# Patient Record
Sex: Male | Born: 1980 | Race: Black or African American | Hispanic: No | Marital: Single | Smoking: Never smoker
Health system: Southern US, Community
[De-identification: ages and names within clinical notes are randomized; demographics above are authoritative.]

## PROBLEM LIST (undated history)

## (undated) ENCOUNTER — Emergency Department (HOSPITAL_BASED_OUTPATIENT_CLINIC_OR_DEPARTMENT_OTHER): Admission: EM | Payer: 59 | Source: Home / Self Care

## (undated) DIAGNOSIS — I10 Essential (primary) hypertension: Secondary | ICD-10-CM

## (undated) DIAGNOSIS — J45909 Unspecified asthma, uncomplicated: Secondary | ICD-10-CM

## (undated) HISTORY — DX: Unspecified asthma, uncomplicated: J45.909

## (undated) HISTORY — DX: Essential (primary) hypertension: I10

---

## 2001-12-04 ENCOUNTER — Emergency Department (HOSPITAL_COMMUNITY): Admission: EM | Admit: 2001-12-04 | Discharge: 2001-12-05 | Payer: Self-pay | Admitting: Emergency Medicine

## 2001-12-04 ENCOUNTER — Encounter: Payer: Self-pay | Admitting: Emergency Medicine

## 2003-02-12 ENCOUNTER — Emergency Department (HOSPITAL_COMMUNITY): Admission: EM | Admit: 2003-02-12 | Discharge: 2003-02-12 | Payer: Self-pay | Admitting: Emergency Medicine

## 2005-02-05 ENCOUNTER — Emergency Department (HOSPITAL_COMMUNITY): Admission: EM | Admit: 2005-02-05 | Discharge: 2005-02-05 | Payer: Self-pay | Admitting: Emergency Medicine

## 2012-06-22 ENCOUNTER — Telehealth: Payer: Self-pay | Admitting: "Endocrinology

## 2012-09-23 NOTE — Telephone Encounter (Signed)
Opened in error

## 2014-02-12 ENCOUNTER — Ambulatory Visit (INDEPENDENT_AMBULATORY_CARE_PROVIDER_SITE_OTHER): Payer: Managed Care, Other (non HMO) | Admitting: Family

## 2014-02-12 ENCOUNTER — Encounter (INDEPENDENT_AMBULATORY_CARE_PROVIDER_SITE_OTHER): Payer: Self-pay

## 2014-02-12 ENCOUNTER — Encounter: Payer: Self-pay | Admitting: Family

## 2014-02-12 VITALS — BP 110/76 | HR 64 | Temp 97.6°F | Resp 16 | Ht 66.0 in | Wt 198.1 lb

## 2014-02-12 DIAGNOSIS — Z0000 Encounter for general adult medical examination without abnormal findings: Secondary | ICD-10-CM

## 2014-02-12 DIAGNOSIS — N4889 Other specified disorders of penis: Secondary | ICD-10-CM

## 2014-02-12 DIAGNOSIS — N489 Disorder of penis, unspecified: Secondary | ICD-10-CM | POA: Insufficient documentation

## 2014-02-12 LAB — CBC WITH DIFFERENTIAL/PLATELET
Basophils Absolute: 0.1 10*3/uL (ref 0.0–0.1)
Basophils Relative: 1 % (ref 0–1)
Eosinophils Absolute: 0.2 10*3/uL (ref 0.0–0.7)
Eosinophils Relative: 3 % (ref 0–5)
HCT: 43.2 % (ref 39.0–52.0)
Hemoglobin: 14.9 g/dL (ref 13.0–17.0)
Lymphocytes Relative: 35 % (ref 12–46)
Lymphs Abs: 1.8 10*3/uL (ref 0.7–4.0)
MCH: 29.1 pg (ref 26.0–34.0)
MCHC: 34.5 g/dL (ref 30.0–36.0)
MCV: 84.4 fL (ref 78.0–100.0)
Monocytes Absolute: 0.4 10*3/uL (ref 0.1–1.0)
Monocytes Relative: 7 % (ref 3–12)
Neutro Abs: 2.8 10*3/uL (ref 1.7–7.7)
Neutrophils Relative %: 54 % (ref 43–77)
Platelets: 247 10*3/uL (ref 150–400)
RBC: 5.12 MIL/uL (ref 4.22–5.81)
RDW: 14.2 % (ref 11.5–15.5)
WBC: 5.1 10*3/uL (ref 4.0–10.5)

## 2014-02-12 LAB — LIPID PANEL
Cholesterol: 166 mg/dL (ref 0–200)
HDL: 39 mg/dL — ABNORMAL LOW (ref >39)
LDL Cholesterol: 114 mg/dL — ABNORMAL HIGH (ref 0–99)
Total CHOL/HDL Ratio: 4.3 Ratio
Triglycerides: 64 mg/dL (ref <150)
VLDL: 13 mg/dL (ref 0–40)

## 2014-02-12 LAB — BASIC METABOLIC PANEL WITH GFR
BUN: 14 mg/dL (ref 6–23)
CO2: 29 mEq/L (ref 19–32)
Calcium: 9.6 mg/dL (ref 8.4–10.5)
Chloride: 103 mEq/L (ref 96–112)
Creat: 1.31 mg/dL (ref 0.50–1.35)
GFR, Est African American: 83 mL/min
GFR, Est Non African American: 72 mL/min
Glucose, Bld: 86 mg/dL (ref 70–99)
Potassium: 4.3 mEq/L (ref 3.5–5.3)
Sodium: 139 mEq/L (ref 135–145)

## 2014-02-12 LAB — HEPATIC FUNCTION PANEL
ALT: 18 U/L (ref 0–53)
AST: 21 U/L (ref 0–37)
Albumin: 4.4 g/dL (ref 3.5–5.2)
Alkaline Phosphatase: 64 U/L (ref 39–117)
Bilirubin, Direct: 0.1 mg/dL (ref 0.0–0.3)
Indirect Bilirubin: 0.5 mg/dL (ref 0.2–1.2)
Total Bilirubin: 0.6 mg/dL (ref 0.2–1.2)
Total Protein: 7.6 g/dL (ref 6.0–8.3)

## 2014-02-12 LAB — TSH: TSH: 1.18 u[IU]/mL (ref 0.350–4.500)

## 2014-02-12 NOTE — Assessment & Plan Note (Signed)
Could be large clusters of genital warts.  I am not certain. Will refer to dermatology for further evaluation.

## 2014-02-12 NOTE — Progress Notes (Signed)
Pre visit review using our clinic review tool, if applicable. No additional management support is needed unless otherwise documented below in the visit note. 

## 2014-02-12 NOTE — Progress Notes (Signed)
Subjective:    Patient ID: Maxwell [name], male    DOB: 31-Oct-1981, 33 y.o.   MRN: [id number]  HPI  Maxwell [name] is a 33 yr old male who presents today to establish care.  Pt new to establish care. Pt thinks he is current with his tetanus vaccine but cannot remember the date. Pt is fasting and would like labs drawn today.  Asthma- as a child, no issues as an adult.  Patient presents today for complete physical.  Immunizations: thinks last tetanus was about 5 yrs ago Diet: not healthy- needs to eat more fruits/veggies. Exercise:  Enjoys basketball, gym  Reports + penile lesions x 1 year, non-painful/non-pruritic.  Has mole on left lateral calf  Review of Systems  Constitutional: Negative for unexpected weight change.  HENT: Negative for hearing loss and postnasal drip.   Eyes: Negative for visual disturbance.  Respiratory: Negative for cough and shortness of breath.   Cardiovascular: Negative for chest pain.  Gastrointestinal: Negative for nausea, diarrhea, constipation and blood in stool.  Genitourinary: Negative for dysuria, frequency and hematuria.  Musculoskeletal: Negative for arthralgias and neck stiffness.  Skin: Negative for rash.  Neurological: Negative for headaches.  Hematological: Negative for adenopathy.  Psychiatric/Behavioral:       Denies depression/anxiety   Past Medical History  Diagnosis Date  . Asthma     childhood asthma    History   Social History  . Marital Status: Single    Spouse Name: N/A    Number of Children: N/A  . Years of Education: N/A   Occupational History  . Not on file.   Social History Main Topics  . Smoking status: Never Smoker   . Smokeless tobacco: Never Used  . Alcohol Use: No  . Drug Use: Not on file  . Sexual Activity: Not on file   Other Topics Concern  . Not on file   Social History Narrative   Married   No children   Advertising account executive degree   Enjoys basketball   Grew up in [place].    History  reviewed. No pertinent past surgical history.  Family History  Problem Relation Age of Onset  . Diabetes Sister   . Cancer Maternal Uncle     prostate  . Heart disease Neg Hx   . Hypertension Neg Hx   . Kidney disease Neg Hx     No Known Allergies  No current outpatient prescriptions on file prior to visit.   No current facility-administered medications on file prior to visit.    BP 110/76  Pulse 64  Temp(Src) 97.6 F (36.4 C) (Oral)  Resp 16  Ht 5\' 6"  (1.676 m)  Wt 198 lb 1.9 oz (89.867 kg)  BMI 31.99 kg/m2  SpO2 98% Body mass index is 31.99 kg/(m^2).        Objective:   Physical Exam  Physical Exam  Constitutional: He is oriented to person, place, and time. He appears well-developed and well-nourished. No distress.  HENT:  Head: Normocephalic and atraumatic.  Right Ear: Tympanic membrane and ear canal normal.  Left Ear: Tympanic membrane and ear canal normal.  Mouth/Throat: Oropharynx is clear and moist.  Eyes: Pupils are equal, round, and reactive to light. No scleral icterus.  Neck: Normal range of motion. No thyromegaly present.  Cardiovascular: Normal rate and regular rhythm.   No murmur heard. Pulmonary/Chest: Effort normal and breath sounds normal. No respiratory distress. He has no wheezes. He has no rales. He exhibits  no tenderness.  Abdominal: Soft. Bowel sounds are normal. He exhibits no distension and no mass. There is no tenderness. There is no rebound and no guarding.  Musculoskeletal: He exhibits no edema.  Lymphadenopathy:    He has no cervical adenopathy.  Neurological: He is alert and oriented to person, place, and time. He has normal patellar reflexes. He exhibits normal muscle tone. Coordination normal.  Skin: Skin is warm and dry. he has a lesion left lateral calf- which is hyperpigmented, raised and rough.  Has 4 large hyperpigmented lesions noted on penile shaft.   Psychiatric: He has a normal mood and affect. His behavior is normal.  Judgment and thought content normal.          Assessment & Plan:         Assessment & Plan:

## 2014-02-12 NOTE — Patient Instructions (Signed)
Please work on healthy diet and weight loss. Continue regular exercise. You will be contacted about your referral to dermatology. Please let us know if you have not heard back about your appointment in 1 week.  Complete lab work prior to leaving. Follow up in 1 year, sooner if problems or concerns. Welcome to [facility]!

## 2014-02-13 LAB — URINALYSIS, ROUTINE W REFLEX MICROSCOPIC
Glucose, UA: NEGATIVE mg/dL
Hgb urine dipstick: NEGATIVE
Leukocytes, UA: NEGATIVE
Nitrite: NEGATIVE
Protein, ur: NEGATIVE mg/dL
Specific Gravity, Urine: >1.03 — ABNORMAL HIGH (ref 1.005–1.030)
Urobilinogen, UA: 1 mg/dL (ref 0.0–1.0)
pH: 5.5 (ref 5.0–8.0)

## 2014-02-13 LAB — URINALYSIS, MICROSCOPIC ONLY
Bacteria, UA: NONE SEEN
Casts: NONE SEEN
Crystals: NONE SEEN
Squamous Epithelial / HPF: NONE SEEN

## 2014-09-13 ENCOUNTER — Telehealth: Payer: Self-pay

## 2014-09-13 DIAGNOSIS — D229 Melanocytic nevi, unspecified: Secondary | ICD-10-CM

## 2014-09-13 NOTE — Telephone Encounter (Signed)
Maxwell [name] [phone] (H)  [name] called he would like a referral to an dermatologist.

## 2014-09-13 NOTE — Telephone Encounter (Signed)
Spoke with pt. He states he has a mole on his left leg that has been enlarging over the last 2 years and is getting darker.  He states he mentioned this at a previous visit. Please advise.

## 2014-09-14 ENCOUNTER — Encounter: Payer: Self-pay | Admitting: Family

## 2015-02-10 ENCOUNTER — Telehealth: Payer: Self-pay | Admitting: Family

## 2015-02-10 NOTE — Telephone Encounter (Signed)
CPE letter sent °

## 2015-02-18 ENCOUNTER — Encounter: Payer: Managed Care, Other (non HMO) | Admitting: Family

## 2015-02-28 ENCOUNTER — Encounter: Payer: Managed Care, Other (non HMO) | Admitting: Family

## 2016-03-01 ENCOUNTER — Telehealth: Payer: Self-pay | Admitting: Behavioral Health

## 2016-03-01 ENCOUNTER — Encounter: Payer: Self-pay | Admitting: Behavioral Health

## 2016-03-01 NOTE — Telephone Encounter (Signed)
Pre-Visit Call completed with patient and chart updated.   Pre-Visit Info documented in Specialty Comments under [employer].    

## 2016-03-02 ENCOUNTER — Ambulatory Visit (INDEPENDENT_AMBULATORY_CARE_PROVIDER_SITE_OTHER): Payer: Managed Care, Other (non HMO) | Admitting: Family

## 2016-03-02 ENCOUNTER — Encounter: Payer: Self-pay | Admitting: Family

## 2016-03-02 VITALS — BP 108/86 | HR 56 | Temp 97.9°F | Resp 16 | Ht 66.0 in | Wt 193.5 lb

## 2016-03-02 DIAGNOSIS — Z0000 Encounter for general adult medical examination without abnormal findings: Secondary | ICD-10-CM | POA: Diagnosis not present

## 2016-03-02 DIAGNOSIS — N4889 Other specified disorders of penis: Secondary | ICD-10-CM

## 2016-03-02 DIAGNOSIS — Z0001 Encounter for general adult medical examination with abnormal findings: Secondary | ICD-10-CM | POA: Diagnosis not present

## 2016-03-02 DIAGNOSIS — N489 Disorder of penis, unspecified: Secondary | ICD-10-CM

## 2016-03-02 LAB — HEPATIC FUNCTION PANEL
ALK PHOS: 64 U/L (ref 39–117)
ALT: 19 U/L (ref 0–53)
AST: 23 U/L (ref 0–37)
Albumin: 4.3 g/dL (ref 3.5–5.2)
BILIRUBIN DIRECT: 0.1 mg/dL (ref 0.0–0.3)
BILIRUBIN TOTAL: 0.7 mg/dL (ref 0.2–1.2)
Total Protein: 7.9 g/dL (ref 6.0–8.3)

## 2016-03-02 LAB — TSH: TSH: 0.83 u[IU]/mL (ref 0.35–4.50)

## 2016-03-02 LAB — CBC WITH DIFFERENTIAL/PLATELET
BASOS ABS: 0 10*3/uL (ref 0.0–0.1)
Basophils Relative: 0.8 % (ref 0.0–3.0)
EOS ABS: 0.2 10*3/uL (ref 0.0–0.7)
Eosinophils Relative: 4 % (ref 0.0–5.0)
HCT: 43.2 % (ref 39.0–52.0)
Hemoglobin: 14.4 g/dL (ref 13.0–17.0)
LYMPHS ABS: 1.7 10*3/uL (ref 0.7–4.0)
Lymphocytes Relative: 33 % (ref 12.0–46.0)
MCHC: 33.4 g/dL (ref 30.0–36.0)
MCV: 86.9 fl (ref 78.0–100.0)
MONOS PCT: 8.8 % (ref 3.0–12.0)
Monocytes Absolute: 0.4 10*3/uL (ref 0.1–1.0)
NEUTROS ABS: 2.7 10*3/uL (ref 1.4–7.7)
NEUTROS PCT: 53.4 % (ref 43.0–77.0)
PLATELETS: 229 10*3/uL (ref 150.0–400.0)
RBC: 4.97 Mil/uL (ref 4.22–5.81)
RDW: 14.8 % (ref 11.5–15.5)
WBC: 5 10*3/uL (ref 4.0–10.5)

## 2016-03-02 LAB — URINALYSIS, ROUTINE W REFLEX MICROSCOPIC
BILIRUBIN URINE: NEGATIVE
Ketones, ur: NEGATIVE
LEUKOCYTES UA: NEGATIVE
Nitrite: NEGATIVE
PH: 6 (ref 5.0–8.0)
Total Protein, Urine: NEGATIVE
Urine Glucose: NEGATIVE
Urobilinogen, UA: 0.2 (ref 0.0–1.0)
WBC, UA: NONE SEEN (ref 0–?)

## 2016-03-02 LAB — BASIC METABOLIC PANEL
BUN: 19 mg/dL (ref 6–23)
CALCIUM: 9.6 mg/dL (ref 8.4–10.5)
CO2: 30 meq/L (ref 19–32)
CREATININE: 1.21 mg/dL (ref 0.40–1.50)
Chloride: 102 mEq/L (ref 96–112)
GFR: 88.11 mL/min (ref >60.00)
GLUCOSE: 90 mg/dL (ref 70–99)
Potassium: 4.2 mEq/L (ref 3.5–5.1)
SODIUM: 137 meq/L (ref 135–145)

## 2016-03-02 LAB — LIPID PANEL
CHOL/HDL RATIO: 3
Cholesterol: 131 mg/dL (ref 0–200)
HDL: 39.7 mg/dL (ref >39.00)
LDL CALC: 80 mg/dL (ref 0–99)
NONHDL: 91.55
Triglycerides: 56 mg/dL (ref 0.0–149.0)
VLDL: 11.2 mg/dL (ref 0.0–40.0)

## 2016-03-02 NOTE — Assessment & Plan Note (Signed)
Discussed healthy diet, exercise, weight loss. Obtain routine labs, declines Tdap

## 2016-03-02 NOTE — Progress Notes (Signed)
Subjective:    Patient ID: Maxwell [name], male    DOB: 12/09/1980, 35 y.o.   MRN: [id number]  HPI  Mr. Maxwell [name] is a 35 yr old male who presents today for cpx.   Patient presents today for complete physical.  Immunizations: declines Tdap Diet: reports diet is improved.  Exercise:reports that he is doing bootcamp 3 x a week.   Penile lesion- reports that he saw derm. Was given "cream" (? Aldara) also had lesion frozen. Reports no change in lesion with these measures.   Review of Systems  Constitutional: Negative for fever.  HENT: Negative for rhinorrhea.   Respiratory: Negative for cough and shortness of breath.   Cardiovascular: Negative for chest pain and leg swelling.  Gastrointestinal: Negative for diarrhea and constipation.  Genitourinary: Negative for dysuria and frequency.  Musculoskeletal: Negative for myalgias and arthralgias.  Skin: Negative for rash.  Neurological: Negative for headaches.  Hematological: Negative for adenopathy.  Psychiatric/Behavioral:       Denies depression/anxiety   Past Medical History  Diagnosis Date  . Asthma     childhood asthma    Social History   Social History  . Marital Status: Single    Spouse Name: N/A  . Number of Children: N/A  . Years of Education: N/A   Occupational History  . Not on file.   Social History Main Topics  . Smoking status: Never Smoker   . Smokeless tobacco: Never Used  . Alcohol Use: No  . Drug Use: Not on file  . Sexual Activity: Not on file   Other Topics Concern  . Not on file   Social History Narrative   Married   No children   Advertising account executive degree   Enjoys basketball   Grew up in [place].    No past surgical history on file.  Family History  Problem Relation Age of Onset  . Diabetes Sister   . Cancer Maternal Uncle     prostate  . Heart disease Neg Hx   . Hypertension Neg Hx   . Kidney disease Neg Hx     No Known Allergies  No current outpatient prescriptions on  file prior to visit.   No current facility-administered medications on file prior to visit.    BP 108/86 mmHg  Pulse 56  Temp(Src) 97.9 F (36.6 C) (Oral)  Resp 16  Ht 5\' 6"  (1.676 m)  Wt 193 lb 8 oz (87.771 kg)  BMI 31.25 kg/m2  SpO2 98%       Objective:   Physical Exam  Physical Exam  Constitutional: He is oriented to person, place, and time. He appears well-developed and well-nourished. No distress.  HENT:  Head: Normocephalic and atraumatic.  Right Ear: Tympanic membrane and ear canal normal.  Left Ear: Tympanic membrane and ear canal normal.  Mouth/Throat: Oropharynx is clear and moist.  Eyes: Pupils are equal, round, and reactive to light. No scleral icterus.  Neck: Normal range of motion. No thyromegaly present.  Cardiovascular: Normal rate and regular rhythm.   No murmur heard. Pulmonary/Chest: Effort normal and breath sounds normal. No respiratory distress. He has no wheezes. He has no rales. He exhibits no tenderness.  Abdominal: Soft. Bowel sounds are normal. He exhibits no distension and no mass. There is no tenderness. There is no rebound and no guarding.  Musculoskeletal: He exhibits no edema.  Lymphadenopathy:    He has no cervical adenopathy.  Neurological: He is alert and oriented to person, place,  and time. He has normal patellar reflexes. He exhibits normal muscle tone. Coordination normal.  Skin: Skin is warm and dry.  Genital: not examined Psychiatric: He has a normal mood and affect. His behavior is normal. Judgment and thought content normal.          Assessment & Plan:  Penile lesion- requests referral back to dermatology.  May need excision since aldara and liquid nitro have not helped.        Assessment & Plan:

## 2016-03-02 NOTE — Patient Instructions (Addendum)
Please complete lab work prior to leaving. Continue health diet and exercise.  Please work on weight loss. You will be contacted about your referral to dermatology.  Follow up  In 1 year for complete physical.

## 2016-03-02 NOTE — Progress Notes (Signed)
Pre visit review using our clinic review tool, if applicable. No additional management support is needed unless otherwise documented below in the visit note/SLS  

## 2016-03-03 ENCOUNTER — Encounter: Payer: Self-pay | Admitting: Family

## 2016-08-20 ENCOUNTER — Ambulatory Visit (INDEPENDENT_AMBULATORY_CARE_PROVIDER_SITE_OTHER): Payer: Managed Care, Other (non HMO) | Admitting: Family

## 2016-08-20 ENCOUNTER — Encounter: Payer: Self-pay | Admitting: Family

## 2016-08-20 VITALS — BP 121/83 | HR 42 | Temp 97.9°F | Resp 16 | Ht 66.0 in | Wt 189.0 lb

## 2016-08-20 DIAGNOSIS — N4889 Other specified disorders of penis: Secondary | ICD-10-CM

## 2016-08-20 DIAGNOSIS — R1310 Dysphagia, unspecified: Secondary | ICD-10-CM

## 2016-08-20 DIAGNOSIS — N489 Disorder of penis, unspecified: Secondary | ICD-10-CM

## 2016-08-20 MED ORDER — PANTOPRAZOLE SODIUM 40 MG PO TBEC: 40.0000 mg | DELAYED_RELEASE_TABLET | Freq: Every day | ORAL | 3 refills | Status: DC

## 2016-08-20 NOTE — Patient Instructions (Signed)
For swallowing problem please begin protonix once daily.  You will be contacted about your referral to dermatology.

## 2016-08-20 NOTE — Progress Notes (Signed)
Pre visit review using our clinic review tool, if applicable. No additional management support is needed unless otherwise documented below in the visit note. 

## 2016-08-20 NOTE — Progress Notes (Signed)
   Subjective:    Patient ID: Maxwell [name], male    DOB: June 06, 1981, 35 y.o.   MRN: [id number]  HPI  Maxwell [name] is a 35 yr old male who presents today with chief complaint of dysphagia.  Reports some pain with swallowing liquids and solids. Happens about once every 2 weeks.  Denies nausea/vomitting throat or mouth pain.   Penile lesion- notes that he never followed through with dermatology referral.  Reports lesion is still unchanged. Would like to reinitiate referral. Declines flu shot.   Review of Systems    see HPI  Past Medical History:  Diagnosis Date  . Asthma    childhood asthma     Social History   Social History  . Marital status: Single    Spouse name: N/A  . Number of children: N/A  . Years of education: N/A   Occupational History  . Not on file.   Social History Main Topics  . Smoking status: Never Smoker  . Smokeless tobacco: Never Used  . Alcohol use No  . Drug use: Unknown  . Sexual activity: Not on file   Other Topics Concern  . Not on file   Social History Narrative   Married   No children   Advertising account executive degree   Enjoys basketball   Grew up in [place].    No past surgical history on file.  Family History  Problem Relation Age of Onset  . Diabetes Sister   . Cancer Maternal Uncle     prostate  . Heart disease Neg Hx   . Hypertension Neg Hx   . Kidney disease Neg Hx     No Known Allergies  No current outpatient prescriptions on file prior to visit.   No current facility-administered medications on file prior to visit.     BP 121/83 (BP Location: Right Arm, Cuff Size: Large)   Pulse (!) 42   Temp 97.9 F (36.6 C) (Oral)   Resp 16   Ht 5\' 6"  (1.676 m)   Wt 189 lb (85.7 kg)   SpO2 100% Comment: room air  BMI 30.51 kg/m    Objective:   Physical Exam  Constitutional: He is oriented to person, place, and time. He appears well-developed and well-nourished. No distress.  HENT:  Head: Normocephalic and atraumatic.    Cardiovascular: Normal rate and regular rhythm.   No murmur heard. Pulmonary/Chest: Effort normal and breath sounds normal. No respiratory distress. He has no wheezes. He has no rales.  Musculoskeletal: He exhibits no edema.  Neurological: He is alert and oriented to person, place, and time.  Skin: Skin is warm and dry.  Psychiatric: He has a normal mood and affect. His behavior is normal. Thought content normal.          Assessment & Plan:  Dysphagia- new.  Will initiate protonix. Plan to see patient back in 1 month. If symptoms are not resolved will initiate GI referral. Pt verbalizes understanding.  I noted his low pulse after he left today. He is asymptomatic- plan to repeat at his follow up visit. (he is an athlete- basketball player) which may account for his low HR.

## 2016-09-17 ENCOUNTER — Ambulatory Visit: Payer: Managed Care, Other (non HMO) | Admitting: Family

## 2017-06-19 ENCOUNTER — Ambulatory Visit (INDEPENDENT_AMBULATORY_CARE_PROVIDER_SITE_OTHER): Payer: BLUE CROSS/BLUE SHIELD | Admitting: Family

## 2017-06-19 ENCOUNTER — Encounter: Payer: Self-pay | Admitting: Family

## 2017-06-19 VITALS — BP 124/84 | HR 51 | Temp 97.7°F | Ht 66.0 in | Wt 191.6 lb

## 2017-06-19 DIAGNOSIS — Z0000 Encounter for general adult medical examination without abnormal findings: Secondary | ICD-10-CM | POA: Diagnosis not present

## 2017-06-19 LAB — BASIC METABOLIC PANEL
BUN: 16 mg/dL (ref 6–23)
CALCIUM: 9.5 mg/dL (ref 8.4–10.5)
CO2: 31 meq/L (ref 19–32)
CREATININE: 1.21 mg/dL (ref 0.40–1.50)
Chloride: 103 mEq/L (ref 96–112)
GFR: 87.45 mL/min (ref >60.00)
GLUCOSE: 92 mg/dL (ref 70–99)
Potassium: 4 mEq/L (ref 3.5–5.1)
SODIUM: 136 meq/L (ref 135–145)

## 2017-06-19 LAB — TSH: TSH: 0.82 u[IU]/mL (ref 0.35–4.50)

## 2017-06-19 LAB — URINALYSIS, ROUTINE W REFLEX MICROSCOPIC
Bilirubin Urine: NEGATIVE
Ketones, ur: NEGATIVE
Leukocytes, UA: NEGATIVE
Nitrite: NEGATIVE
PH: 5.5 (ref 5.0–8.0)
RBC / HPF: NONE SEEN (ref 0–?)
TOTAL PROTEIN, URINE-UPE24: NEGATIVE
Urine Glucose: NEGATIVE
Urobilinogen, UA: 0.2 (ref 0.0–1.0)
WBC, UA: NONE SEEN (ref 0–?)

## 2017-06-19 LAB — HEPATIC FUNCTION PANEL
ALK PHOS: 67 U/L (ref 39–117)
ALT: 15 U/L (ref 0–53)
AST: 21 U/L (ref 0–37)
Albumin: 4.3 g/dL (ref 3.5–5.2)
BILIRUBIN TOTAL: 0.8 mg/dL (ref 0.2–1.2)
Bilirubin, Direct: 0.1 mg/dL (ref 0.0–0.3)
Total Protein: 7.8 g/dL (ref 6.0–8.3)

## 2017-06-19 LAB — CBC WITH DIFFERENTIAL/PLATELET
BASOS ABS: 0.1 10*3/uL (ref 0.0–0.1)
Basophils Relative: 1 % (ref 0.0–3.0)
EOS ABS: 0.3 10*3/uL (ref 0.0–0.7)
Eosinophils Relative: 5.6 % — ABNORMAL HIGH (ref 0.0–5.0)
HCT: 44.8 % (ref 39.0–52.0)
Hemoglobin: 14.9 g/dL (ref 13.0–17.0)
LYMPHS ABS: 1.9 10*3/uL (ref 0.7–4.0)
Lymphocytes Relative: 35.4 % (ref 12.0–46.0)
MCHC: 33.2 g/dL (ref 30.0–36.0)
MCV: 89.5 fl (ref 78.0–100.0)
MONO ABS: 0.3 10*3/uL (ref 0.1–1.0)
Monocytes Relative: 6.4 % (ref 3.0–12.0)
NEUTROS ABS: 2.7 10*3/uL (ref 1.4–7.7)
NEUTROS PCT: 51.6 % (ref 43.0–77.0)
PLATELETS: 215 10*3/uL (ref 150.0–400.0)
RBC: 5.01 Mil/uL (ref 4.22–5.81)
RDW: 13.7 % (ref 11.5–15.5)
WBC: 5.3 10*3/uL (ref 4.0–10.5)

## 2017-06-19 LAB — LIPID PANEL
Cholesterol: 116 mg/dL (ref 0–200)
HDL: 40.1 mg/dL (ref >39.00)
LDL CALC: 63 mg/dL (ref 0–99)
NonHDL: 75.52
Total CHOL/HDL Ratio: 3
Triglycerides: 61 mg/dL (ref 0.0–149.0)
VLDL: 12.2 mg/dL (ref 0.0–40.0)

## 2017-06-19 NOTE — Patient Instructions (Signed)
Please complete lab work prior to leaving.  Continue healthy diet and regular exercise.  

## 2017-06-19 NOTE — Progress Notes (Signed)
Subjective:    Patient ID: Maxwell [name], male    DOB: Apr 13, 1981, 36 y.o.   MRN: [id number]  HPI  Patient presents today for complete physical.  Immunizations: unsure, declines Diet: reports diet is healthy Exercise: enjoys circuit training  Vision: up to date Dental: due, he will schedule Wt Readings from Last 3 Encounters:  06/19/17 191 lb 9.6 oz (86.9 kg)  08/20/16 189 lb (85.7 kg)  03/02/16 193 lb 8 oz (87.8 kg)     Review of Systems  Constitutional: Negative for unexpected weight change.  HENT: Negative for hearing loss and rhinorrhea.   Eyes: Negative for visual disturbance.  Respiratory: Negative for cough.   Cardiovascular: Negative for leg swelling.  Gastrointestinal: Negative for constipation and diarrhea.  Genitourinary: Negative for dysuria and frequency.  Musculoskeletal: Negative for arthralgias and myalgias.  Skin: Negative for rash.  Neurological: Negative for headaches.  Hematological: Negative for adenopathy.  Psychiatric/Behavioral:       Denies depression/anxiety   Past Medical History:  Diagnosis Date  . Asthma    childhood asthma     Social History   Social History  . Marital status: Single    Spouse name: N/A  . Number of children: N/A  . Years of education: N/A   Occupational History  . Not on file.   Social History Main Topics  . Smoking status: Never Smoker  . Smokeless tobacco: Never Used  . Alcohol use No  . Drug use: Unknown  . Sexual activity: Not on file   Other Topics Concern  . Not on file   Social History Narrative   Married   No children   Advertising account executive degree   Enjoys basketball   Grew up in [place].    No past surgical history on file.  Family History  Problem Relation Age of Onset  . Diabetes Sister   . Cancer Maternal Uncle        prostate  . Heart disease Neg Hx   . Hypertension Neg Hx   . Kidney disease Neg Hx     No Known Allergies  No current outpatient prescriptions on file  prior to visit.   No current facility-administered medications on file prior to visit.     BP 140/80   Pulse (!) 51   Temp 97.7 F (36.5 C) (Oral)   Ht 5\' 6"  (1.676 m)   Wt 191 lb 9.6 oz (86.9 kg)   SpO2 100%   BMI 30.93 kg/m        Objective:   Physical Exam  Physical Exam  Constitutional: Muscular appearing AA male, He is oriented to person, place, and time. He appears well-developed and well-nourished. No distress.  HENT:  Head: Normocephalic and atraumatic.  Right Ear: Tympanic membrane and ear canal normal.  Left Ear: Tympanic membrane and ear canal normal.  Mouth/Throat: Oropharynx is clear and moist.  Eyes: Pupils are equal, round, and reactive to light. No scleral icterus.  Neck: Normal range of motion. No thyromegaly present.  Cardiovascular: Normal rate and regular rhythm.   No murmur heard. Pulmonary/Chest: Effort normal and breath sounds normal. No respiratory distress. He has no wheezes. He has no rales. He exhibits no tenderness.  Abdominal: Soft. Bowel sounds are normal. He exhibits no distension and no mass. There is no tenderness. There is no rebound and no guarding.  Musculoskeletal: He exhibits no edema.  Lymphadenopathy:    He has no cervical adenopathy.  Neurological: He is alert  and oriented to person, place, and time. He has normal patellar reflexes. He exhibits normal muscle tone. Coordination normal.  Skin: Skin is warm and dry.  Psychiatric: He has a normal mood and affect. His behavior is normal. Judgment and thought content normal.           Assessment & Plan:         Assessment & Plan:   Preventative care- encouraged pt to continue healthy diet and regular exercise.  Will obtain routine lab work. We did discuss that his BMI is above goal, however he stated that he recently had his body fat calculated his body fat percentage is 14%. He declines tetanus today.

## 2017-06-28 ENCOUNTER — Telehealth: Payer: Self-pay | Admitting: *Deleted

## 2017-06-28 NOTE — Telephone Encounter (Signed)
Received call from pt requesting lab results from 06/19/17. Upon review of EPIC it looks like a letter was mailed last week. Gave verbal results to pt via phone. Pt asked if we had contact information to the doctor that PCP recommended him to for a sperm count. Advised him that there was no mention in recent office note and PCP is out of the office until Monday and we could get back with him then. Pt states he may still have the information in his paperwork and will check that first. If he cannot find information he will call us back.

## 2018-01-27 ENCOUNTER — Ambulatory Visit: Payer: BLUE CROSS/BLUE SHIELD | Admitting: Family

## 2018-02-14 ENCOUNTER — Ambulatory Visit (INDEPENDENT_AMBULATORY_CARE_PROVIDER_SITE_OTHER): Payer: PRIVATE HEALTH INSURANCE | Admitting: Family

## 2018-02-14 ENCOUNTER — Encounter: Payer: Self-pay | Admitting: Family

## 2018-02-14 VITALS — BP 122/85 | HR 55 | Temp 98.6°F | Resp 16 | Ht 65.0 in | Wt 190.8 lb

## 2018-02-14 DIAGNOSIS — R21 Rash and other nonspecific skin eruption: Secondary | ICD-10-CM | POA: Diagnosis not present

## 2018-02-14 DIAGNOSIS — R221 Localized swelling, mass and lump, neck: Secondary | ICD-10-CM

## 2018-02-14 DIAGNOSIS — Z0000 Encounter for general adult medical examination without abnormal findings: Secondary | ICD-10-CM | POA: Diagnosis not present

## 2018-02-14 LAB — LIPID PANEL
CHOL/HDL RATIO: 2
Cholesterol: 104 mg/dL (ref 0–200)
HDL: 42.5 mg/dL (ref >39.00)
LDL Cholesterol: 54 mg/dL (ref 0–99)
NONHDL: 61.27
Triglycerides: 36 mg/dL (ref 0.0–149.0)
VLDL: 7.2 mg/dL (ref 0.0–40.0)

## 2018-02-14 LAB — URINALYSIS, ROUTINE W REFLEX MICROSCOPIC
BILIRUBIN URINE: NEGATIVE
Hgb urine dipstick: NEGATIVE
KETONES UR: NEGATIVE
LEUKOCYTES UA: NEGATIVE
Nitrite: NEGATIVE
PH: 7 (ref 5.0–8.0)
SPECIFIC GRAVITY, URINE: 1.015 (ref 1.000–1.030)
Total Protein, Urine: NEGATIVE
Urine Glucose: NEGATIVE
Urobilinogen, UA: 0.2 (ref 0.0–1.0)

## 2018-02-14 LAB — CBC WITH DIFFERENTIAL/PLATELET
BASOS ABS: 0 10*3/uL (ref 0.0–0.1)
Basophils Relative: 0.7 % (ref 0.0–3.0)
EOS ABS: 0.1 10*3/uL (ref 0.0–0.7)
Eosinophils Relative: 2 % (ref 0.0–5.0)
HCT: 40.9 % (ref 39.0–52.0)
Hemoglobin: 13.7 g/dL (ref 13.0–17.0)
Lymphocytes Relative: 23.7 % (ref 12.0–46.0)
Lymphs Abs: 1.5 10*3/uL (ref 0.7–4.0)
MCHC: 33.5 g/dL (ref 30.0–36.0)
MCV: 88.1 fl (ref 78.0–100.0)
MONO ABS: 0.4 10*3/uL (ref 0.1–1.0)
Monocytes Relative: 6.2 % (ref 3.0–12.0)
NEUTROS ABS: 4.3 10*3/uL (ref 1.4–7.7)
Neutrophils Relative %: 67.4 % (ref 43.0–77.0)
PLATELETS: 239 10*3/uL (ref 150.0–400.0)
RBC: 4.65 Mil/uL (ref 4.22–5.81)
RDW: 14 % (ref 11.5–15.5)
WBC: 6.4 10*3/uL (ref 4.0–10.5)

## 2018-02-14 LAB — BASIC METABOLIC PANEL
BUN: 18 mg/dL (ref 6–23)
CALCIUM: 9.2 mg/dL (ref 8.4–10.5)
CO2: 29 mEq/L (ref 19–32)
CREATININE: 1.06 mg/dL (ref 0.40–1.50)
Chloride: 104 mEq/L (ref 96–112)
GFR: 101.51 mL/min (ref >60.00)
GLUCOSE: 85 mg/dL (ref 70–99)
Potassium: 4 mEq/L (ref 3.5–5.1)
Sodium: 138 mEq/L (ref 135–145)

## 2018-02-14 LAB — TSH: TSH: 1.08 u[IU]/mL (ref 0.35–4.50)

## 2018-02-14 LAB — HEPATIC FUNCTION PANEL
ALK PHOS: 79 U/L (ref 39–117)
ALT: 11 U/L (ref 0–53)
AST: 19 U/L (ref 0–37)
Albumin: 4 g/dL (ref 3.5–5.2)
BILIRUBIN DIRECT: 0.2 mg/dL (ref 0.0–0.3)
Total Bilirubin: 0.6 mg/dL (ref 0.2–1.2)
Total Protein: 7.8 g/dL (ref 6.0–8.3)

## 2018-02-14 NOTE — Patient Instructions (Addendum)
Use Cetaphil wash. Apply cetaphil ointment. Change to free and clear detergent. Call if rash worsens or if it does not improve I the next few weeks.  Please schedule routine dental exam/cleaning.  Schedule your neck ultrasound on the first floor.

## 2018-02-14 NOTE — Progress Notes (Signed)
Subjective:    Patient ID: Maxwell [name], male    DOB: [birth year], 37 y.o.   MRN: [id number]  HPI  Maxwell [name] is a 37 yr old male who presents today for cpx.  Patient presents today for complete physical.  Immunizations: declines tetanus.   Diet:  healthy Exercise: 5 days a week Vision: up to date Dental: due, will schedule  Reports that he was sick about 1 month ago.    Rash- Both legs, recently changed laundry detergent. using an organic soap. Wt Readings from Last 3 Encounters:  02/14/18 190 lb 12.8 oz (86.5 kg)  06/19/17 191 lb 9.6 oz (86.9 kg)  08/20/16 189 lb (85.7 kg)    Neck mass- has been present for several weeks. Non-tender.  Notes that he did have some viral symptoms about 1 month ago.  Review of Systems  Constitutional: Negative for unexpected weight change.  HENT: Negative for hearing loss and rhinorrhea.   Eyes: Negative for visual disturbance.  Respiratory: Negative for cough.   Cardiovascular: Negative for leg swelling.  Gastrointestinal: Negative for diarrhea and nausea.  Genitourinary: Negative for dysuria and frequency.  Musculoskeletal: Negative for arthralgias and myalgias.  Skin: Positive for rash.  Neurological: Negative for headaches.  Hematological: Negative for adenopathy.  Psychiatric/Behavioral:       Denies depression/anxiety   Past Medical History:  Diagnosis Date  . Asthma    childhood asthma     Social History   Socioeconomic History  . Marital status: Single    Spouse name: Not on file  . Number of children: Not on file  . Years of education: Not on file  . Highest education level: Not on file  Occupational History  . Not on file  Social Needs  . Financial resource strain: Not on file  . Food insecurity:    Worry: Not on file    Inability: Not on file  . Transportation needs:    Medical: Not on file    Non-medical: Not on file  Tobacco Use  . Smoking status: Never Smoker  . Smokeless tobacco: Never Used  Substance and  Sexual Activity  . Alcohol use: No  . Drug use: Never  . Sexual activity: Not on file  Lifestyle  . Physical activity:    Days per week: Not on file    Minutes per session: Not on file  . Stress: Not on file  Relationships  . Social connections:    Talks on phone: Not on file    Gets together: Not on file    Attends religious service: Not on file    Active member of club or organization: Not on file    Attends meetings of clubs or organizations: Not on file    Relationship status: Not on file  . Intimate partner violence:    Fear of current or ex partner: Not on file    Emotionally abused: Not on file    Physically abused: Not on file    Forced sexual activity: Not on file  Other Topics Concern  . Not on file  Social History Narrative   Married   No children   Advertising account executive degree   Enjoys basketball   Grew up in [place].    No past surgical history on file.  Family History  Problem Relation Age of Onset  . Diabetes Sister   . Cancer Maternal Uncle        prostate  . Heart disease Neg Hx   .  Hypertension Neg Hx   . Kidney disease Neg Hx     No Known Allergies  No current outpatient medications on file prior to visit.   No current facility-administered medications on file prior to visit.     BP 122/85   Pulse (!) 55   Temp 98.6 F (37 C) (Oral)   Resp 16   Ht 5\' 5"  (1.651 m)   Wt 190 lb 12.8 oz (86.5 kg)   SpO2 100%   BMI 31.75 kg/m       Objective:   Physical Exam  Physical Exam  Constitutional: He is oriented to person, place, and time. He appears well-developed and well-nourished. No distress.  HENT:  Head: Normocephalic and atraumatic.  Right Ear: Tympanic membrane and ear canal normal.  Left Ear: Tympanic membrane and ear canal normal.  Mouth/Throat: Oropharynx is clear and moist.  Eyes: Pupils are equal, round, and reactive to light. No scleral icterus.  Neck: Normal range of motion. No thyromegaly present. Firm,  non-tender mass noted upper anterior neck below ear near jaw line Cardiovascular: Normal rate and regular rhythm.   No murmur heard. Pulmonary/Chest: Effort normal and breath sounds normal. No respiratory distress. He has no wheezes. He has no rales. He exhibits no tenderness.  Abdominal: Soft. Bowel sounds are normal. He exhibits no distension and no mass. There is no tenderness. There is no rebound and no guarding.  Musculoskeletal: He exhibits no edema.  Lymphadenopathy:    He has no cervical adenopathy.  Neurological: He is alert and oriented to person, place, and time. He has normal patellar reflexes. He exhibits normal muscle tone. Coordination normal.  Skin: Skin is warm. Dry eczematous rash noted bilateral LE Psychiatric: He has a normal mood and affect. His behavior is normal. Judgment and thought content normal.           Assessment & Plan:   Skin rash- ? Dry skin versus eczema versus reaction to detergent.  Pt is advised as follows:   Use Cetaphil wash. Apply cetaphil ointment. Change to free and clear detergent. Call if rash worsens or if it does not improve I the next few weeks.   Preventative care- discussed importance of scheduling routine dental cleaning. Obtain routine lab work, declines tetanus.   Neck mass- new.  ? Reactive lymph node. Will obtain US to further evaluate and plan to bring the patient back in 3 weeks for follow up.       Assessment & Plan:

## 2018-02-16 ENCOUNTER — Encounter: Payer: Self-pay | Admitting: Family

## 2018-02-20 ENCOUNTER — Telehealth: Payer: Self-pay | Admitting: Family

## 2018-02-20 NOTE — Telephone Encounter (Signed)
Pt is scheduled for a lab appt at 10:00 on 02/21/18. There are no orders in place. Can orders please be placed or do we need to reschedule him?    Please advise

## 2018-02-21 ENCOUNTER — Other Ambulatory Visit: Payer: PRIVATE HEALTH INSURANCE

## 2018-02-21 NOTE — Telephone Encounter (Signed)
I don't see that we need any labs. He needs a 3 week follow up OV with me and we can discuss then.   Thanks

## 2018-02-24 NOTE — Telephone Encounter (Signed)
Appointment was canceled.

## 2018-03-01 ENCOUNTER — Ambulatory Visit (HOSPITAL_BASED_OUTPATIENT_CLINIC_OR_DEPARTMENT_OTHER): Payer: PRIVATE HEALTH INSURANCE

## 2018-03-07 ENCOUNTER — Ambulatory Visit: Payer: PRIVATE HEALTH INSURANCE | Admitting: Family

## 2018-03-07 DIAGNOSIS — Z0289 Encounter for other administrative examinations: Secondary | ICD-10-CM

## 2018-03-11 ENCOUNTER — Encounter: Payer: Self-pay | Admitting: Family

## 2018-03-12 ENCOUNTER — Encounter: Payer: Self-pay | Admitting: Family

## 2018-03-12 ENCOUNTER — Telehealth: Payer: Self-pay | Admitting: *Deleted

## 2018-03-12 NOTE — Telephone Encounter (Signed)
I reviewed chart and do not see that he is due for any labs. Per phone note of 02/20/18, pt is due for office visit with Melissa. Lab appointment has been cancelled for tomorrow and message left on pt's voicemail to return my call to schedule recommended follow up with PCP.

## 2018-03-12 NOTE — Telephone Encounter (Signed)
-----   Message from Caffie Pinto sent at 03/12/2018 12:43 PM EDT ----- Regarding: lab orders Hey, this patient has a lab appt tomorrow and no orders. I dont see any notes or abnormal results to f/u on. I leave at 1pm today. I just wanted to see if he really needed to be here today. Will you f/u with Angie if you need to?

## 2018-03-13 ENCOUNTER — Other Ambulatory Visit: Payer: PRIVATE HEALTH INSURANCE

## 2018-03-17 ENCOUNTER — Ambulatory Visit: Payer: PRIVATE HEALTH INSURANCE | Admitting: Internal Medicine

## 2018-03-29 ENCOUNTER — Ambulatory Visit (HOSPITAL_BASED_OUTPATIENT_CLINIC_OR_DEPARTMENT_OTHER): Payer: PRIVATE HEALTH INSURANCE

## 2018-04-04 ENCOUNTER — Ambulatory Visit: Payer: PRIVATE HEALTH INSURANCE | Admitting: Family

## 2018-04-25 ENCOUNTER — Ambulatory Visit: Payer: PRIVATE HEALTH INSURANCE | Admitting: Family

## 2019-02-16 ENCOUNTER — Ambulatory Visit (INDEPENDENT_AMBULATORY_CARE_PROVIDER_SITE_OTHER): Payer: PRIVATE HEALTH INSURANCE | Admitting: Family

## 2019-02-16 ENCOUNTER — Encounter: Payer: Self-pay | Admitting: Family

## 2019-02-16 ENCOUNTER — Other Ambulatory Visit: Payer: Self-pay

## 2019-02-16 VITALS — BP 126/89 | HR 51 | Temp 98.1°F | Resp 17 | Ht 65.0 in | Wt 198.6 lb

## 2019-02-16 DIAGNOSIS — Z23 Encounter for immunization: Secondary | ICD-10-CM

## 2019-02-16 DIAGNOSIS — Z0000 Encounter for general adult medical examination without abnormal findings: Secondary | ICD-10-CM

## 2019-02-16 LAB — HEPATIC FUNCTION PANEL
ALT: 12 U/L (ref 0–53)
AST: 17 U/L (ref 0–37)
Albumin: 4.1 g/dL (ref 3.5–5.2)
Alkaline Phosphatase: 68 U/L (ref 39–117)
Bilirubin, Direct: 0.1 mg/dL (ref 0.0–0.3)
Total Bilirubin: 0.6 mg/dL (ref 0.2–1.2)
Total Protein: 7.4 g/dL (ref 6.0–8.3)

## 2019-02-16 LAB — BASIC METABOLIC PANEL
BUN: 15 mg/dL (ref 6–23)
CO2: 30 mEq/L (ref 19–32)
Calcium: 9.2 mg/dL (ref 8.4–10.5)
Chloride: 103 mEq/L (ref 96–112)
Creatinine, Ser: 1.04 mg/dL (ref 0.40–1.50)
GFR: 97.09 mL/min (ref >60.00)
GLUCOSE: 87 mg/dL (ref 70–99)
Potassium: 3.9 mEq/L (ref 3.5–5.1)
Sodium: 138 mEq/L (ref 135–145)

## 2019-02-16 LAB — CBC WITH DIFFERENTIAL/PLATELET
Basophils Absolute: 0 10*3/uL (ref 0.0–0.1)
Basophils Relative: 0.6 % (ref 0.0–3.0)
EOS ABS: 0.2 10*3/uL (ref 0.0–0.7)
Eosinophils Relative: 4.1 % (ref 0.0–5.0)
HCT: 42.8 % (ref 39.0–52.0)
Hemoglobin: 14.5 g/dL (ref 13.0–17.0)
LYMPHS ABS: 1.5 10*3/uL (ref 0.7–4.0)
Lymphocytes Relative: 30.3 % (ref 12.0–46.0)
MCHC: 33.9 g/dL (ref 30.0–36.0)
MCV: 87.9 fl (ref 78.0–100.0)
Monocytes Absolute: 0.3 10*3/uL (ref 0.1–1.0)
Monocytes Relative: 6.9 % (ref 3.0–12.0)
Neutro Abs: 2.9 10*3/uL (ref 1.4–7.7)
Neutrophils Relative %: 58.1 % (ref 43.0–77.0)
Platelets: 218 10*3/uL (ref 150.0–400.0)
RBC: 4.87 Mil/uL (ref 4.22–5.81)
RDW: 14.3 % (ref 11.5–15.5)
WBC: 5.1 10*3/uL (ref 4.0–10.5)

## 2019-02-16 LAB — LIPID PANEL
Cholesterol: 134 mg/dL (ref 0–200)
HDL: 40 mg/dL (ref >39.00)
LDL Cholesterol: 82 mg/dL (ref 0–99)
NonHDL: 94.35
TRIGLYCERIDES: 63 mg/dL (ref 0.0–149.0)
Total CHOL/HDL Ratio: 3
VLDL: 12.6 mg/dL (ref 0.0–40.0)

## 2019-02-16 LAB — TSH: TSH: 0.62 u[IU]/mL (ref 0.35–4.50)

## 2019-02-16 MED ORDER — FLUCONAZOLE 150 MG PO TABS: ORAL_TABLET | ORAL | 0 refills | Status: DC

## 2019-02-16 NOTE — Patient Instructions (Signed)
Please begin diflucan (2 tabs today and 2 tabs in 1 week) for rash on legs. Continue healthy diet and regular exercise. Complete lab work prior to leaving

## 2019-02-16 NOTE — Progress Notes (Signed)
Subjective:    Patient ID: Maxwell [name], male    DOB: 31-Jul-1981, 38 y.o.   MRN: [id number]  HPI   Patient presents today for complete physical.  Immunizations:  Declines flu shot. Due for tetanus Diet: healthy Exercise: regular Vision: last year. Dental: reports that he goes annually Wt Readings from Last 3 Encounters:  02/16/19 198 lb 9.6 oz (90.1 kg)  02/14/18 190 lb 12.8 oz (86.5 kg)  06/19/17 191 lb 9.6 oz (86.9 kg)   Skin rash- reports that he was using selsun lotion but they "stopped making it." Has rash on both legs.   Also reports resolution of the penile lesion that he had a few years back.    Review of Systems  Constitutional: Negative for unexpected weight change.  HENT: Negative for hearing loss and rhinorrhea.   Eyes: Negative for visual disturbance.  Respiratory: Negative for cough.   Cardiovascular: Negative for leg swelling.  Gastrointestinal: Negative for constipation and diarrhea.  Genitourinary: Negative for dysuria, frequency and hematuria.  Musculoskeletal: Negative for arthralgias and myalgias.  Neurological: Negative for headaches.  Hematological: Negative for adenopathy.  Psychiatric/Behavioral:       Denies depression/anxiety   Past Medical History:  Diagnosis Date  . Asthma    childhood asthma     Social History   Socioeconomic History  . Marital status: Single    Spouse name: Not on file  . Number of children: Not on file  . Years of education: Not on file  . Highest education level: Not on file  Occupational History  . Not on file  Social Needs  . Financial resource strain: Not on file  . Food insecurity:    Worry: Not on file    Inability: Not on file  . Transportation needs:    Medical: Not on file    Non-medical: Not on file  Tobacco Use  . Smoking status: Never Smoker  . Smokeless tobacco: Never Used  Substance and Sexual Activity  . Alcohol use: No  . Drug use: Never  . Sexual activity: Not on file  Lifestyle  .  Physical activity:    Days per week: Not on file    Minutes per session: Not on file  . Stress: Not on file  Relationships  . Social connections:    Talks on phone: Not on file    Gets together: Not on file    Attends religious service: Not on file    Active member of club or organization: Not on file    Attends meetings of clubs or organizations: Not on file    Relationship status: Not on file  . Intimate partner violence:    Fear of current or ex partner: Not on file    Emotionally abused: Not on file    Physically abused: Not on file    Forced sexual activity: Not on file  Other Topics Concern  . Not on file  Social History Narrative   Married   No children   Advertising account executive degree   Enjoys basketball   Grew up in [place].    No past surgical history on file.  Family History  Problem Relation Age of Onset  . Diabetes Sister   . Cancer Maternal Uncle        prostate  . Heart disease Neg Hx   . Hypertension Neg Hx   . Kidney disease Neg Hx     No Known Allergies  No current outpatient medications  on file prior to visit.   No current facility-administered medications on file prior to visit.     BP 126/89 (BP Location: Right Arm, Patient Position: Sitting, Cuff Size: Normal)   Pulse (!) 51   Temp 98.1 F (36.7 C) (Oral)   Resp 17   Ht 5\' 5"  (1.651 m)   Wt 198 lb 9.6 oz (90.1 kg)   SpO2 100%   BMI 33.05 kg/m       Objective:   Physical Exam Physical Exam  Constitutional: He is oriented to person, place, and time. He appears well-developed and well-nourished. No distress.  HENT:  Head: Normocephalic and atraumatic.  Right Ear: Tympanic membrane and ear canal normal.  Left Ear: Tympanic membrane and ear canal normal.  Mouth/Throat: Oropharynx is clear and moist.  Eyes: Pupils are equal, round, and reactive to light. No scleral icterus.  Neck: Normal range of motion. No thyromegaly present.  Cardiovascular: Normal rate and regular rhythm.    No murmur heard. Pulmonary/Chest: Effort normal and breath sounds normal. No respiratory distress. He has no wheezes. He has no rales. He exhibits no tenderness.  Abdominal: Soft. Bowel sounds are normal. He exhibits no distension and no mass. There is no tenderness. There is no rebound and no guarding.  Musculoskeletal: He exhibits no edema.  Lymphadenopathy:    He has no cervical adenopathy.  Neurological: He is alert and oriented to person, place, and time. He has normal patellar reflexes. He exhibits normal muscle tone. Coordination normal.  Skin: Skin is warm and dry. hyperpigmented rash noted on bilateral legs Psychiatric: He has a normal mood and affect. His behavior is normal. Judgment and thought content normal.           Assessment & Plan:  Tinea versicolor- will rx with diflucan.  Preventative care- encouraged pt to continue healthy diet and regular exercise. BMI is high but he is quite muscular and lean appearing. Obtain routine lab work.         Assessment & Plan:

## 2019-02-17 ENCOUNTER — Encounter: Payer: Self-pay | Admitting: Family

## 2019-05-19 ENCOUNTER — Other Ambulatory Visit: Payer: Self-pay | Admitting: *Deleted

## 2019-05-19 DIAGNOSIS — Z20822 Contact with and (suspected) exposure to covid-19: Secondary | ICD-10-CM

## 2019-05-20 ENCOUNTER — Other Ambulatory Visit: Payer: Self-pay | Admitting: Pediatric Intensive Care

## 2019-05-23 LAB — NOVEL CORONAVIRUS, NAA: SARS-CoV-2, NAA: NOT DETECTED

## 2019-08-14 ENCOUNTER — Ambulatory Visit: Payer: Self-pay | Admitting: *Deleted

## 2019-08-14 ENCOUNTER — Encounter (HOSPITAL_BASED_OUTPATIENT_CLINIC_OR_DEPARTMENT_OTHER): Payer: Self-pay | Admitting: Adult Health

## 2019-08-14 ENCOUNTER — Emergency Department (HOSPITAL_BASED_OUTPATIENT_CLINIC_OR_DEPARTMENT_OTHER)
Admission: EM | Admit: 2019-08-14 | Discharge: 2019-08-14 | Disposition: A | Discharge status: Home / Self Care | Payer: 59 | Attending: Emergency Medicine | Admitting: Emergency Medicine

## 2019-08-14 ENCOUNTER — Emergency Department (HOSPITAL_BASED_OUTPATIENT_CLINIC_OR_DEPARTMENT_OTHER): Payer: 59

## 2019-08-14 ENCOUNTER — Other Ambulatory Visit: Payer: Self-pay

## 2019-08-14 ENCOUNTER — Telehealth: Payer: Self-pay | Admitting: Family

## 2019-08-14 DIAGNOSIS — J45909 Unspecified asthma, uncomplicated: Secondary | ICD-10-CM | POA: Diagnosis not present

## 2019-08-14 DIAGNOSIS — Z20828 Contact with and (suspected) exposure to other viral communicable diseases: Secondary | ICD-10-CM | POA: Diagnosis not present

## 2019-08-14 DIAGNOSIS — R079 Chest pain, unspecified: Secondary | ICD-10-CM | POA: Insufficient documentation

## 2019-08-14 DIAGNOSIS — R0789 Other chest pain: Secondary | ICD-10-CM

## 2019-08-14 LAB — CBC
HCT: 46.1 % (ref 39.0–52.0)
Hemoglobin: 15.4 g/dL (ref 13.0–17.0)
MCH: 29.5 pg (ref 26.0–34.0)
MCHC: 33.4 g/dL (ref 30.0–36.0)
MCV: 88.3 fL (ref 80.0–100.0)
Platelets: 237 10*3/uL (ref 150–400)
RBC: 5.22 MIL/uL (ref 4.22–5.81)
RDW: 13.3 % (ref 11.5–15.5)
WBC: 5.3 10*3/uL (ref 4.0–10.5)
nRBC: 0 % (ref 0.0–0.2)

## 2019-08-14 LAB — BASIC METABOLIC PANEL
Anion gap: 9 (ref 5–15)
BUN: 13 mg/dL (ref 6–20)
CO2: 26 mmol/L (ref 22–32)
Calcium: 9.4 mg/dL (ref 8.9–10.3)
Chloride: 103 mmol/L (ref 98–111)
Creatinine, Ser: 1.1 mg/dL (ref 0.61–1.24)
GFR calc Af Amer: >60 mL/min (ref >60)
GFR calc non Af Amer: >60 mL/min (ref >60)
Glucose, Bld: 93 mg/dL (ref 70–99)
Potassium: 3.9 mmol/L (ref 3.5–5.1)
Sodium: 138 mmol/L (ref 135–145)

## 2019-08-14 LAB — SARS CORONAVIRUS 2 (TAT 6-24 HRS): SARS Coronavirus 2: NEGATIVE

## 2019-08-14 LAB — TROPONIN I (HIGH SENSITIVITY): Troponin I (High Sensitivity): 4 ng/L (ref <18)

## 2019-08-14 NOTE — ED Triage Notes (Signed)
Maxwell [name] presents with intermittent left sided chest pain that began at 8:30 this morning. Described as sharp and can be reproduced with touch. HE denies SOB, dizziness and nausesa. His sister passed away last week and he is working and in full time school. He states he has never had a problem with management of stress but this could be related to everything going on but he wanted to be safe. HE denies pain at this time.

## 2019-08-14 NOTE — ED Provider Notes (Signed)
[name] Provider Note   CSN: [id number] Arrival date & time: 08/14/19  1136     History   Chief Complaint Chief Complaint  Patient presents with  . Chest Pain    HPI Maxwell [name] is a 38 y.o. male.     Pt presents to the ED today with left sided CP.  The pt developed the pain around 0830 today.  He said it was sharp.  He denies sob, n/v, dizziness.  No f/c.  No known covid exposures.  He has been under a lot of stress lately.  His sister died last week (he said from complications of alcoholic cirrhosis) and he did the eulogy.  He has a full time job and is in school getting his masters degree in [place].  The pt feels fine now.      Past Medical History:  Diagnosis Date  . Asthma    childhood asthma    Patient Active Problem List   Diagnosis Date Noted  . Routine general medical examination at a health care facility 02/12/2014  . Penile lesion 02/12/2014    History reviewed. No pertinent surgical history.      Home Medications    Prior to Admission medications   Medication Sig Start Date End Date Taking? Authorizing Provider  fluconazole (DIFLUCAN) 150 MG tablet Take 2 tabs by mouth now and 2 tabs in 1 week. 02/16/19   [name], NP    Family History Family History  Problem Relation Age of Onset  . Diabetes Sister   . Cancer Maternal Uncle        prostate  . Heart disease Neg Hx   . Hypertension Neg Hx   . Kidney disease Neg Hx     Social History Social History   Tobacco Use  . Smoking status: Never Smoker  . Smokeless tobacco: Never Used  Substance Use Topics  . Alcohol use: No  . Drug use: Never     Allergies   Patient has no known allergies.   Review of Systems Review of Systems  Cardiovascular: Positive for chest pain.  All other systems reviewed and are negative.    Physical Exam Updated Vital Signs BP (!) 126/92   Pulse (!) 58   Temp 98.5 F (36.9 C) (Oral)   Resp 15   Ht 5\' 6"   (1.676 m)   Wt 88.9 kg   SpO2 99%   BMI 31.64 kg/m   Physical Exam Vitals signs and nursing note reviewed.  Constitutional:      Appearance: He is well-developed.  HENT:     Head: Normocephalic and atraumatic.  Eyes:     Extraocular Movements: Extraocular movements intact.     Pupils: Pupils are equal, round, and reactive to light.  Neck:     Musculoskeletal: Normal range of motion and neck supple.  Cardiovascular:     Rate and Rhythm: Normal rate and regular rhythm.     Heart sounds: Normal heart sounds.  Pulmonary:     Effort: Pulmonary effort is normal.     Breath sounds: Normal breath sounds.  Abdominal:     General: Bowel sounds are normal.     Palpations: Abdomen is soft.  Musculoskeletal: Normal range of motion.  Skin:    General: Skin is warm.     Capillary Refill: Capillary refill takes less than 2 seconds.  Neurological:     General: No focal deficit present.     Mental Status: He is alert and  oriented to person, place, and time.  Psychiatric:        Mood and Affect: Mood normal.        Behavior: Behavior normal.      ED Treatments / Results  Labs (all labs ordered are listed, but only abnormal results are displayed) Labs Reviewed  SARS CORONAVIRUS 2 (TAT 6-24 HRS)  BASIC METABOLIC PANEL  CBC  TROPONIN I (HIGH SENSITIVITY)  TROPONIN I (HIGH SENSITIVITY)    EKG EKG Interpretation  Date/Time:  Friday August 14 2019 11:45:55 EDT Ventricular Rate:  67 PR Interval:    QRS Duration: 77 QT Interval:  389 QTC Calculation: 411 R Axis:   23 Text Interpretation:  Sinus rhythm Consider left atrial enlargement Borderline T wave abnormalities No old tracing to compare Confirmed by Isla Pence [phone]) on 08/14/2019 12:52:49 PM   Radiology Dg Chest 2 View  Result Date: 08/14/2019 CLINICAL DATA:  Chest pain EXAM: CHEST - 2 VIEW COMPARISON:  None. FINDINGS: The heart size and mediastinal contours are within normal limits. Both lungs are clear. The  visualized skeletal structures are unremarkable. IMPRESSION: No active cardiopulmonary disease. Electronically Signed   By: Davina Poke M.D.   On: 08/14/2019 12:32    Procedures Procedures (including critical care time)  Medications Ordered in ED Medications - No data to display   Initial Impression / Assessment and Plan / ED Course  I have reviewed the triage vital signs and the nursing notes.  Pertinent labs & imaging results that were available during my care of the patient were reviewed by me and considered in my medical decision making (see chart for details).       No pain while here.  Work up negative.  Pt's bp was high when he first arrived, but it is normal now.  The pt has a heart score of 0.  EKG and trop neg.  CXR negative.  Pt is stable for d/c.  Final Clinical Impressions(s) / ED Diagnoses   Final diagnoses:  Atypical chest pain    ED Discharge Orders    None       Isla Pence, MD 08/14/19 1350

## 2019-08-14 NOTE — Telephone Encounter (Signed)
Pt stated he had a smoothie this morning around 8 and after that started having chest pains in the left chest area. He stated the pain comes and goes and he is having it now. He has had a lot of stress in the last week with his sister passing and him having to do the eulogy at her grave site. So it has been emotional for him this past week . He also stated being out in the rain he had a sore throat but now states it is better.    He denies nausea, dizziness, or shortness of breath. He thinks he feels a little warm but did not check his temp. The pain has been going on for almost 3 hours off and on. Per protocol, he needs to be seen in the ED. Pt voiced understanding. He will be going to [employer].  Will route to [facility] at [facility] for review.   Reason for Disposition . [1] Chest pain (or "angina") comes and goes AND [2] is happening more often (increasing in frequency) or getting worse (increasing in severity)  Answer Assessment - Initial Assessment Questions 1. LOCATION: "Where does it hurt?"       Left side of chest 2. RADIATION: "Does the pain go anywhere else?" (e.g., into neck, jaw, arms, back)     no 3. ONSET: "When did the chest pain begin?" (Minutes, hours or days)      This morning 4. PATTERN "Does the pain come and go, or has it been constant since it started?"  "Does it get worse with exertion?"      Comes and goes 5. DURATION: "How long does it last" (e.g., seconds, minutes, hours)     A few mins 6. SEVERITY: "How bad is the pain?"  (e.g., Scale 1-10; mild, moderate, or severe)    - MILD (1-3): doesn't interfere with normal activities     - MODERATE (4-7): interferes with normal activities or awakens from sleep    - SEVERE (8-10): excruciating pain, unable to do any normal activities       Pain # 3 or 4 7. CARDIAC RISK FACTORS: "Do you have any history of heart problems or risk factors for heart disease?" (e.g., angina, prior heart attack; diabetes, high blood  pressure, high cholesterol, smoker, or strong family history of heart disease)     no 8. PULMONARY RISK FACTORS: "Do you have any history of lung disease?"  (e.g., blood clots in lung, asthma, emphysema, birth control pills)     no 9. CAUSE: "What do you think is causing the chest pain?"     Not sure what is causing this, possibly stress 10. OTHER SYMPTOMS: "Do you have any other symptoms?" (e.g., dizziness, nausea, vomiting, sweating, fever, difficulty breathing, cough)       no 11. PREGNANCY: "Is there any chance you are pregnant?" "When was your last menstrual period?"       n/a  Protocols used: CHEST PAIN-A-AH

## 2019-08-14 NOTE — ED Notes (Signed)
Pt on monitor 

## 2019-12-10 ENCOUNTER — Other Ambulatory Visit: Payer: Self-pay

## 2019-12-10 DIAGNOSIS — Z20822 Contact with and (suspected) exposure to covid-19: Secondary | ICD-10-CM

## 2019-12-11 LAB — NOVEL CORONAVIRUS, NAA: SARS-CoV-2, NAA: NOT DETECTED

## 2020-02-19 ENCOUNTER — Encounter: Payer: PRIVATE HEALTH INSURANCE | Admitting: Family

## 2020-04-08 ENCOUNTER — Other Ambulatory Visit: Payer: Self-pay

## 2020-04-08 ENCOUNTER — Ambulatory Visit (INDEPENDENT_AMBULATORY_CARE_PROVIDER_SITE_OTHER): Payer: PRIVATE HEALTH INSURANCE | Admitting: Family

## 2020-04-08 ENCOUNTER — Encounter: Payer: Self-pay | Admitting: Family

## 2020-04-08 VITALS — BP 135/85 | HR 61 | Temp 97.8°F | Resp 16 | Ht 66.0 in | Wt 207.0 lb

## 2020-04-08 DIAGNOSIS — Z0000 Encounter for general adult medical examination without abnormal findings: Secondary | ICD-10-CM | POA: Diagnosis not present

## 2020-04-08 DIAGNOSIS — Z3141 Encounter for fertility testing: Secondary | ICD-10-CM

## 2020-04-08 NOTE — Patient Instructions (Signed)
Preventive Care 21-39 Years Old, Male Preventive care refers to lifestyle choices and visits with your health care provider that can promote health and wellness. This includes:  A yearly physical exam. This is also called an annual well check.  Regular dental and eye exams.  Immunizations.  Screening for certain conditions.  Healthy lifestyle choices, such as eating a healthy diet, getting regular exercise, not using drugs or products that contain nicotine and tobacco, and limiting alcohol use. What can I expect for my preventive care visit? Physical exam Your health care provider will check:  Height and weight. These may be used to calculate body mass index (BMI), which is a measurement that tells if you are at a healthy weight.  Heart rate and blood pressure.  Your skin for abnormal spots. Counseling Your health care provider may ask you questions about:  Alcohol, tobacco, and drug use.  Emotional well-being.  Home and relationship well-being.  Sexual activity.  Eating habits.  Work and work environment. What immunizations do I need?  Influenza (flu) vaccine  This is recommended every year. Tetanus, diphtheria, and pertussis (Tdap) vaccine  You may need a Td booster every 10 years. Varicella (chickenpox) vaccine  You may need this vaccine if you have not already been vaccinated. Human papillomavirus (HPV) vaccine  If recommended by your health care provider, you may need three doses over 6 months. Measles, mumps, and rubella (MMR) vaccine  You may need at least one dose of MMR. You may also need a second dose. Meningococcal conjugate (MenACWY) vaccine  One dose is recommended if you are 19-21 years old and a first-year college student living in a residence Guid[place], or if you have one of several medical conditions. You may also need additional booster doses. Pneumococcal conjugate (PCV13) vaccine  You may need this if you have certain conditions and were not  previously vaccinated. Pneumococcal polysaccharide (PPSV23) vaccine  You may need one or two doses if you smoke cigarettes or if you have certain conditions. Hepatitis A vaccine  You may need this if you have certain conditions or if you travel or work in places where you may be exposed to hepatitis A. Hepatitis B vaccine  You may need this if you have certain conditions or if you travel or work in places where you may be exposed to hepatitis B. Haemophilus influenzae type b (Hib) vaccine  You may need this if you have certain risk factors. You may receive vaccines as individual doses or as more than one vaccine together in one shot (combination vaccines). Talk with your health care provider about the risks and benefits of combination vaccines. What tests do I need? Blood tests  Lipid and cholesterol levels. These may be checked every 5 years starting at age 20.  Hepatitis C test.  Hepatitis B test. Screening   Diabetes screening. This is done by checking your blood sugar (glucose) after you have not eaten for a while (fasting).  Sexually transmitted disease (STD) testing. Talk with your health care provider about your test results, treatment options, and if necessary, the need for more tests. Follow these instructions at home: Eating and drinking   Eat a diet that includes fresh fruits and vegetables, whole grains, lean protein, and low-fat dairy products.  Take vitamin and mineral supplements as recommended by your health care provider.  Do not drink alcohol if your health care provider tells you not to drink.  If you drink alcohol: ? Limit how much you have to 0-2   drinks a day. ? Be aware of how much alcohol is in your drink. In the [place] one drink equals one 12 oz bottle of beer (355 mL), one 5 oz glass of wine (148 mL), or one 1 oz glass of hard liquor (44 mL). Lifestyle  Take daily care of your teeth and gums.  Stay active. Exercise for at least 30 minutes on 5 or  more days each week.  Do not use any products that contain nicotine or tobacco, such as cigarettes, e-cigarettes, and chewing tobacco. If you need help quitting, ask your health care provider.  If you are sexually active, practice safe sex. Use a condom or other form of protection to prevent STIs (sexually transmitted infections). What's next?  Go to your health care provider once a year for a well check visit.  Ask your health care provider how often you should have your eyes and teeth checked.  Stay up to date on all vaccines. This information is not intended to replace advice given to you by your health care provider. Make sure you discuss any questions you have with your health care provider. Document Revised: 11/06/2018 Document Reviewed: 11/06/2018 Elsevier Patient Education  2020 Elsevier Inc.  

## 2020-04-08 NOTE — Progress Notes (Signed)
Subjective:    Patient ID: Maxwell [name], male    DOB: 13-Dec-1980, 39 y.o.   MRN: [id number]  HPI  Patient is a 39 yr old male who presents today for a complete physical.    Patient presents today for complete physical.  Immunizations: tetanus up to date Diet: reports that he is eating a cleaner diet Exercise: enjoys circuit training 4 times a week. 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  She is seeing a specialist for this and he wishes to have evaluation and sperm count evaluation.  Review of Systems  Constitutional: Negative for unexpected weight change.  HENT: Negative for hearing loss and rhinorrhea.   Eyes: Negative for visual disturbance.  Respiratory: Negative for cough and shortness of breath.   Cardiovascular: Negative for chest pain.  Gastrointestinal: Negative for constipation and diarrhea.  Genitourinary: Negative for difficulty urinating, dysuria and frequency.  Musculoskeletal: Negative for arthralgias and myalgias.  Skin: Negative for rash.  Neurological: Negative for headaches.  Hematological: Negative for adenopathy.  Psychiatric/Behavioral:       Denies depression/anxiety   Past Medical History:  Diagnosis Date  . Asthma    childhood asthma     Social History   Socioeconomic History  . Marital status: Single    Spouse name: Not on file  . Number of children: Not on file  . Years of education: Not on file  . Highest education level: Not on file  Occupational History  . Not on file  Tobacco Use  . Smoking status: Never Smoker  . Smokeless tobacco: Never Used  Substance and Sexual Activity  . Alcohol use: No  . Drug use: Never  . Sexual activity: Not on file  Other Topics Concern  . Not on file  Social History Narrative     Married   No children   Advertising account executive degree   Enjoys basketball   Grew up in [place].   Social Determinants of Health   Financial Resource Strain:   . Difficulty of Paying Living Expenses:   Food Insecurity:   . Worried About Charity fundraiser in the Last Year:   . Arboriculturist in the Last Year:   Transportation Needs:   . Film/video editor (Medical):   Marland Kitchen Lack of Transportation (Non-Medical):   Physical Activity:   . Days of Exercise per Week:   . Minutes of Exercise per Session:   Stress:   . Feeling of Stress :   Social Connections:   . Frequency of Communication with Friends and Family:   . Frequency of Social Gatherings with Friends and Family:   . Attends Religious Services:   . Active Member of Clubs or Organizations:   . Attends Archivist Meetings:   Marland Kitchen Marital Status:   Intimate Partner Violence:   . Fear of Current or Ex-Partner:   . Emotionally Abused:   Marland Kitchen Physically Abused:   . Sexually Abused:     No past surgical history on file.  Family History  Problem Relation Age of Onset  . Diabetes Sister   . Cancer Maternal Uncle        prostate  . Liver disease Sister   .  Heart disease Neg Hx   . Hypertension Neg Hx   . Kidney disease Neg Hx     No Known Allergies  No current outpatient medications on file prior to visit.   No current facility-administered medications on file prior to visit.    BP 135/85 (BP Location: Right Arm, Patient Position: Sitting, Cuff Size: Large)   Pulse 61   Temp 97.8 F (36.6 C) (Temporal)   Resp 16   Ht 5\' 6"  (1.676 m)   Wt 207 lb (93.9 kg)   SpO2 99%   BMI 33.41 kg/m       Objective:   Physical Exam  Physical Exam  Constitutional: He is oriented to person, place, and time. He appears well-developed and well-nourished. No distress.  HENT:  Head: Normocephalic and atraumatic.  Right Ear: Tympanic membrane and ear canal normal.  Left Ear: Tympanic membrane and ear canal  normal.  Mouth/Throat: Not examined- pt wearing mask  Eyes: Pupils are equal, round, and reactive to light. No scleral icterus.  Neck: Normal range of motion. No thyromegaly present.  Cardiovascular: Normal rate and regular rhythm.   No murmur heard. Pulmonary/Chest: Effort normal and breath sounds normal. No respiratory distress. He has no wheezes. He has no rales. He exhibits no tenderness.  Abdominal: Soft. Bowel sounds are normal. He exhibits no distension and no mass. There is no tenderness. There is no rebound and no guarding.  Musculoskeletal: He exhibits no edema.  Lymphadenopathy:    He has no cervical adenopathy.  Neurological: He is alert and oriented to person, place, and time. He has normal patellar reflexes. He exhibits normal muscle tone. Coordination normal.  Skin: Skin is warm and dry.  Psychiatric: He has a normal mood and affect. His behavior is normal. Judgment and thought content normal.           Assessment & Plan:   Preventative care- encourage pt to keep up his good work with healthy diet, exercise and weight loss efforts. Immunizations reviewed and up to date. Had normal lab work last year. Will defer lab work at this time. Plan follow up in 1 year.  Infertility testing- will refer to urology for further evaluation.  This visit occurred during the SARS-CoV-2 public health emergency.  Safety protocols were in place, including screening questions prior to the visit, additional usage of staff PPE, and extensive cleaning of exam room while observing appropriate contact time as indicated for disinfecting solutions.          Assessment & Plan:

## 2020-09-07 ENCOUNTER — Other Ambulatory Visit: Payer: 59

## 2020-09-07 ENCOUNTER — Other Ambulatory Visit: Payer: Self-pay

## 2020-09-07 ENCOUNTER — Other Ambulatory Visit: Payer: Self-pay | Admitting: *Deleted

## 2020-09-07 DIAGNOSIS — Z20822 Contact with and (suspected) exposure to covid-19: Secondary | ICD-10-CM

## 2020-09-08 LAB — SARS-COV-2, NAA 2 DAY TAT

## 2020-09-08 LAB — NOVEL CORONAVIRUS, NAA: SARS-CoV-2, NAA: NOT DETECTED

## 2021-04-14 ENCOUNTER — Encounter: Payer: 59 | Admitting: Family

## 2021-04-21 ENCOUNTER — Ambulatory Visit (INDEPENDENT_AMBULATORY_CARE_PROVIDER_SITE_OTHER): Payer: 59 | Admitting: Family

## 2021-04-21 ENCOUNTER — Encounter: Payer: Self-pay | Admitting: Family

## 2021-04-21 ENCOUNTER — Other Ambulatory Visit: Payer: Self-pay

## 2021-04-21 VITALS — BP 128/80 | HR 56 | Temp 98.8°F | Resp 16 | Ht 66.0 in | Wt 206.0 lb

## 2021-04-21 DIAGNOSIS — Z0000 Encounter for general adult medical examination without abnormal findings: Secondary | ICD-10-CM | POA: Diagnosis not present

## 2021-04-21 DIAGNOSIS — S39012A Strain of muscle, fascia and tendon of lower back, initial encounter: Secondary | ICD-10-CM | POA: Insufficient documentation

## 2021-04-21 DIAGNOSIS — E669 Obesity, unspecified: Secondary | ICD-10-CM

## 2021-04-21 NOTE — Assessment & Plan Note (Signed)
Recommended heating pad, ibuprofen prn. Call if pain worsens or if it does not improve.

## 2021-04-21 NOTE — Progress Notes (Signed)
Subjective:   By signing my name below, I, Maxwell [name], attest that this documentation has been prepared under the direction and in the presence of Maxwell Alar NP. 04/21/2021      Patient ID: Maxwell [name], male    DOB: 1981-11-24, 40 y.o.   MRN: [id number]  Chief Complaint  Patient presents with  . Annual Exam    HPI Patient is in today for a comprehensive physical exam.  He complains of back soreness after straining it during weight lifting. It has improved since then. He denies having any cough, cold, constipation, diarrhea, dysuria, frequency, muscle pain, joint pain, headaches, depression, or anxiety at this time. He has had no recent changes to his family medical history. He does not drink, use drugs, or smoke tobacco products. He has 1 male sexual partner. They are currently trying to have a child.  Immunizations: He has 2 moderna Covid-19 vaccines and is willing to get the booster vaccine. He did not receive the flu shot this season. He is not interested in getting the HIV or hepatitis screening at this time.  Diet: He is managing a healthy diet. Exercise: He participates in exercise 5-6 days a week. Colonoscopy: Not completed  Dexa: Not completed. PSA: Not completed. Dental: He is UTD on dental care. Vision: He is UTD on vision care.   Past Medical History:  Diagnosis Date  . Asthma    childhood asthma    No past surgical history on file.  Family History  Problem Relation Age of Onset  . Diabetes Half-Sister   . Cancer Maternal Uncle        prostate  . Liver disease Sister   . Heart disease Neg Hx   . Hypertension Neg Hx   . Kidney disease Neg Hx     Social History   Socioeconomic History  . Marital status: Single    Spouse name: Not on file  . Number of children: Not on file  . Years of education: Not on file  . Highest education level: Not on file  Occupational History  . Not on file  Tobacco Use  . Smoking status: Never Smoker  .  Smokeless tobacco: Never Used  Substance and Sexual Activity  . Alcohol use: No  . Drug use: Never  . Sexual activity: Yes    Partners: Female  Other Topics Concern  . Not on file  Social History Narrative   Married   No children   Advertising account executive degree   Enjoys basketball   Grew up in [place].   Social Determinants of Health   Financial Resource Strain: Not on file  Food Insecurity: Not on file  Transportation Needs: Not on file  Physical Activity: Not on file  Stress: Not on file  Social Connections: Not on file  Intimate Partner Violence: Not on file    No outpatient medications prior to visit.   No facility-administered medications prior to visit.    No Known Allergies  Review of Systems  HENT: Negative for congestion.   Respiratory: Negative for cough.   Gastrointestinal: Negative for constipation and diarrhea.  Genitourinary: Negative for dysuria and frequency.  Musculoskeletal: Positive for back pain. Negative for myalgias.  Neurological: Negative for headaches.  Psychiatric/Behavioral: Negative for depression. The patient is not nervous/anxious.        Objective:    Physical Exam Constitutional:      General: He is not in acute distress.    Appearance: Normal appearance.  He is not ill-appearing.  HENT:     Head: Normocephalic and atraumatic.     Right Ear: Tympanic membrane and external ear normal.     Left Ear: Tympanic membrane and external ear normal.     Ears:     Comments: Bilateral cerumen, partial occlusion Eyes:     Extraocular Movements: Extraocular movements intact.     Pupils: Pupils are equal, round, and reactive to light.     Comments: No nystagmus  Cardiovascular:     Rate and Rhythm: Normal rate and regular rhythm.     Pulses: Normal pulses.     Heart sounds: Normal heart sounds. No murmur heard. No gallop.   Pulmonary:     Effort: Pulmonary effort is normal. No respiratory distress.     Breath sounds: Normal  breath sounds. No wheezing, rhonchi or rales.  Abdominal:     General: Bowel sounds are normal. There is no distension.     Palpations: Abdomen is soft. There is no mass.     Tenderness: There is no abdominal tenderness. There is no guarding or rebound.     Hernia: No hernia is present.  Musculoskeletal:     Comments: 5/5 strength in both upper and lower strength  Lymphadenopathy:     Cervical: No cervical adenopathy.  Skin:    General: Skin is warm and dry.  Neurological:     Mental Status: He is alert and oriented to person, place, and time.     Comments: Normal patellar reflexes.  Psychiatric:        Behavior: Behavior normal.     BP 128/80   Pulse (!) 56   Temp 98.8 F (37.1 C) (Oral)   Resp 16   Ht '5\' 6"'  (1.676 m)   Wt 206 lb (93.4 kg)   SpO2 98%   BMI 33.25 kg/m  Wt Readings from Last 3 Encounters:  04/21/21 206 lb (93.4 kg)  04/08/20 207 lb (93.9 kg)  08/14/19 196 lb (88.9 kg)    Diabetic Foot Exam - Simple   No data filed    Lab Results  Component Value Date   WBC 5.3 08/14/2019   HGB 15.4 08/14/2019   HCT 46.1 08/14/2019   PLT 237 08/14/2019   GLUCOSE 93 08/14/2019   CHOL 134 02/16/2019   TRIG 63.0 02/16/2019   HDL 40.00 02/16/2019   LDLCALC 82 02/16/2019   ALT 12 02/16/2019   AST 17 02/16/2019   NA 138 08/14/2019   K 3.9 08/14/2019   CL 103 08/14/2019   CREATININE 1.10 08/14/2019   BUN 13 08/14/2019   CO2 26 08/14/2019   TSH 0.62 02/16/2019    Lab Results  Component Value Date   TSH 0.62 02/16/2019   Lab Results  Component Value Date   WBC 5.3 08/14/2019   HGB 15.4 08/14/2019   HCT 46.1 08/14/2019   MCV 88.3 08/14/2019   PLT 237 08/14/2019   Lab Results  Component Value Date   NA 138 08/14/2019   K 3.9 08/14/2019   CO2 26 08/14/2019   GLUCOSE 93 08/14/2019   BUN 13 08/14/2019   CREATININE 1.10 08/14/2019   BILITOT 0.6 02/16/2019   ALKPHOS 68 02/16/2019   AST 17 02/16/2019   ALT 12 02/16/2019   PROT 7.4 02/16/2019    ALBUMIN 4.1 02/16/2019   CALCIUM 9.4 08/14/2019   ANIONGAP 9 08/14/2019   GFR 97.09 02/16/2019   Lab Results  Component Value Date   CHOL 134 02/16/2019  Lab Results  Component Value Date   HDL 40.00 02/16/2019   Lab Results  Component Value Date   LDLCALC 82 02/16/2019   Lab Results  Component Value Date   TRIG 63.0 02/16/2019   Lab Results  Component Value Date   CHOLHDL 3 02/16/2019   No results found for: HGBA1C     Assessment & Plan:   Problem List Items Addressed This Visit      Unprioritized   Routine general medical examination at a health care facility    Discussed healthy diet, exercise, weight loss. Recommended covid booster shot.  Tetanus up to date.       Lumbar strain    Recommended heating pad, ibuprofen prn. Call if pain worsens or if it does not improve.        Other Visit Diagnoses    Preventative health care    -  Primary   Relevant Orders   Comp Met (CMET)   Lipid panel   Obesity (BMI 30.0-34.9)       Relevant Orders   Comp Met (CMET)   Lipid panel       No orders of the defined types were placed in this encounter.   I, Maxwell Alar NP, personally preformed the services described in this documentation.  All medical record entries made by the scribe were at my direction and in my presence.  I have reviewed the chart and discharge instructions (if applicable) and agree that the record reflects my personal performance and is accurate and complete. 04/21/2021   I,Maxwell [name],acting as a Education administrator for Nance Pear, NP.,have documented all relevant documentation on the behalf of Nance Pear, NP,as directed by  Nance Pear, NP while in the presence of Nance Pear, NP.   Nance Pear, NP

## 2021-04-21 NOTE — Assessment & Plan Note (Signed)
Discussed healthy diet, exercise, weight loss. Recommended covid booster shot.  Tetanus up to date.

## 2021-04-22 LAB — COMPREHENSIVE METABOLIC PANEL
AG Ratio: 1.3 (calc) (ref 1.0–2.5)
ALT: 13 U/L (ref 9–46)
AST: 17 U/L (ref 10–40)
Albumin: 4.3 g/dL (ref 3.6–5.1)
Alkaline phosphatase (APISO): 63 U/L (ref 36–130)
BUN: 15 mg/dL (ref 7–25)
CO2: 29 mmol/L (ref 20–32)
Calcium: 9.5 mg/dL (ref 8.6–10.3)
Chloride: 101 mmol/L (ref 98–110)
Creat: 1.14 mg/dL (ref 0.60–1.35)
Globulin: 3.4 g/dL (calc) (ref 1.9–3.7)
Glucose, Bld: 80 mg/dL (ref 65–99)
Potassium: 4.5 mmol/L (ref 3.5–5.3)
Sodium: 139 mmol/L (ref 135–146)
Total Bilirubin: 0.6 mg/dL (ref 0.2–1.2)
Total Protein: 7.7 g/dL (ref 6.1–8.1)

## 2021-04-22 LAB — LIPID PANEL
Cholesterol: 145 mg/dL (ref <200)
HDL: 43 mg/dL (ref >=40)
LDL Cholesterol (Calc): 87 mg/dL (calc)
Non-HDL Cholesterol (Calc): 102 mg/dL (calc) (ref <130)
Total CHOL/HDL Ratio: 3.4 (calc) (ref <5.0)
Triglycerides: 60 mg/dL (ref <150)

## 2021-05-04 ENCOUNTER — Encounter: Payer: Self-pay | Admitting: Family

## 2021-05-04 ENCOUNTER — Ambulatory Visit (INDEPENDENT_AMBULATORY_CARE_PROVIDER_SITE_OTHER): Payer: 59 | Admitting: Family

## 2021-05-04 ENCOUNTER — Other Ambulatory Visit: Payer: Self-pay

## 2021-05-04 VITALS — BP 132/90 | HR 75 | Temp 98.0°F | Resp 16

## 2021-05-04 DIAGNOSIS — M5441 Lumbago with sciatica, right side: Secondary | ICD-10-CM | POA: Diagnosis not present

## 2021-05-04 MED ORDER — METHYLPREDNISOLONE 4 MG PO TBPK: ORAL_TABLET | ORAL | 0 refills | Status: DC

## 2021-05-04 MED ORDER — KETOROLAC TROMETHAMINE 30 MG/ML IJ SOLN
30.0000 mg | Freq: Once | INTRAMUSCULAR | Status: AC
  Administered 2021-05-04: 30 mg via INTRAMUSCULAR

## 2021-05-04 NOTE — Progress Notes (Signed)
  Maxwell [name] is a 40 y.o. male with the following history as recorded in EpicCare:  Patient Active Problem List   Diagnosis Date Noted   Lumbar strain 04/21/2021   Routine general medical examination at a health care facility 02/12/2014    Current Outpatient Medications  Medication Sig Dispense Refill   methylPREDNISolone (MEDROL DOSEPAK) 4 MG TBPK tablet Taper as directed 21 tablet 0   No current facility-administered medications for this visit.    Allergies: Patient has no known allergies.  Past Medical History:  Diagnosis Date   Asthma    childhood asthma    No past surgical history on file.  Family History  Problem Relation Age of Onset   Diabetes Half-Sister    Cancer Maternal Uncle        prostate   Liver disease Sister    Heart disease Neg Hx    Hypertension Neg Hx    Kidney disease Neg Hx     Social History   Tobacco Use   Smoking status: Never   Smokeless tobacco: Never  Substance Use Topics   Alcohol use: No    Subjective:   Presents with low back pain; seemed to start with injury at the gym approximately 2 weeks ago; has tried working with chiropractor with limited benefit- had X-rays at chiropractor; yesterday had decompression with chiropractor and has had worsening sensation of numbness/ tingling radiating into right leg; notes that sensation in his leg is so uncomfortable that it is difficult to sit; denies any changes in bowel or bladder habits;      Objective:  Vitals:   05/04/21 1101  BP: 132/90  Pulse: 75  Resp: 16  Temp: 98 F (36.7 C)  SpO2: 97%    General: Well developed, well nourished, in no acute distress  Skin : Warm and dry.  Head: Normocephalic and atraumatic  Lungs: Respirations unlabored;  Musculoskeletal: No deformities; no active joint inflammation  Extremities: No edema, cyanosis, clubbing  Vessels: Symmetric bilaterally  Neurologic: Alert and oriented; speech intact; face symmetrical; moves all extremities well; CNII-XII  intact without focal deficit   Assessment:  1. Acute right-sided low back pain with right-sided sciatica     Plan:  Toradol IM 30 mg given in office for pain; start oral Medrol Dose Pak tomorrow; do not feel MRI warranted at this time; he will consider PT if symptoms persist; he will plan to return to gym in 2 weeks and follow different work out regimen; follow up worse, no better.   This visit occurred during the SARS-CoV-2 public health emergency.  Safety protocols were in place, including screening questions prior to the visit, additional usage of staff PPE, and extensive cleaning of exam room while observing appropriate contact time as indicated for disinfecting solutions.    No follow-ups on file.  No orders of the defined types were placed in this encounter.   Requested Prescriptions   Signed Prescriptions Disp Refills   methylPREDNISolone (MEDROL DOSEPAK) 4 MG TBPK tablet 21 tablet 0    Sig: Taper as directed

## 2021-05-04 NOTE — Patient Instructions (Signed)
Sciatica  Sciatica is pain, numbness, weakness, or tingling along the path of the sciatic nerve. The sciatic nerve starts in the lower back and runs down the back of each leg. The nerve controls the muscles in the lower leg and in the back of the knee. It also provides feeling (sensation) to the back of the thigh, the lower leg, and the sole of the foot. Sciatica is a symptom of another medical condition that pinches or puts pressure on the sciatic nerve. Sciatica most often only affects one side of the body. Sciatica usually goes away on its own or with treatment. In some cases, sciatica may come back (recur). What are the causes? This condition is caused by pressure on the sciatic nerve or pinching of the nerve. This may be the result of: A disk in between the bones of the spine bulging out too far (herniated disk). Age-related changes in the spinal disks. A pain disorder that affects a muscle in the buttock. Extra bone growth near the sciatic nerve. A break (fracture) of the pelvis. Pregnancy. Tumor. This is rare. What increases the risk? The following factors may make you more likely to develop this condition: Playing sports that place pressure or stress on the spine. Having poor strength and flexibility. A history of back injury or surgery. Sitting for long periods of time. Doing activities that involve repetitive bending or lifting. Obesity. What are the signs or symptoms? Symptoms can vary from mild to very severe, and they may include: Any of these problems in the lower back, leg, hip, or buttock: Mild tingling, numbness, or dull aches. Burning sensations. Sharp pains. Numbness in the back of the calf or the sole of the foot. Leg weakness. Severe back pain that makes movement difficult. Symptoms may get worse when you cough, sneeze, or laugh, or when you sit or stand for long periods of time. How is this diagnosed? This condition may be diagnosed based on: Your symptoms and  medical history. A physical exam. Blood tests. Imaging tests, such as: X-rays. MRI. CT scan. How is this treated? In many cases, this condition improves on its own without treatment. However, treatment may include: Reducing or modifying physical activity. Exercising and stretching. Icing and applying heat to the affected area. Medicines that help to: Relieve pain and swelling. Relax your muscles. Injections of medicines that help to relieve pain, irritation, and inflammation around the sciatic nerve (steroids). Surgery. Follow these instructions at home: Medicines Take over-the-counter and prescription medicines only as told by your health care provider. Ask your health care provider if the medicine prescribed to you: Requires you to avoid driving or using heavy machinery. Can cause constipation. You may need to take these actions to prevent or treat constipation: Drink enough fluid to keep your urine pale yellow. Take over-the-counter or prescription medicines. Eat foods that are high in fiber, such as beans, whole grains, and fresh fruits and vegetables. Limit foods that are high in fat and processed sugars, such as fried or sweet foods. Managing pain If directed, put ice on the affected area. Put ice in a plastic bag. Place a towel between your skin and the bag. Leave the ice on for 20 minutes, 2-3 times a day. If directed, apply heat to the affected area. Use the heat source that your health care provider recommends, such as a moist heat pack or a heating pad. Place a towel between your skin and the heat source. Leave the heat on for 20-30 minutes. Remove the  heat if your skin turns bright red. This is especially important if you are unable to feel pain, heat, or cold. You may have a greater risk of getting burned.      Activity Return to your normal activities as told by your health care provider. Ask your health care provider what activities are safe for you. Avoid  activities that make your symptoms worse. Take brief periods of rest throughout the day. When you rest for longer periods, mix in some mild activity or stretching between periods of rest. This will help to prevent stiffness and pain. Avoid sitting for long periods of time without moving. Get up and move around at least one time each hour. Exercise and stretch regularly, as told by your health care provider. Do not lift anything that is heavier than 10 lb (4.5 kg) while you have symptoms of sciatica. When you do not have symptoms, you should still avoid heavy lifting, especially repetitive heavy lifting. When you lift objects, always use proper lifting technique, which includes: Bending your knees. Keeping the load close to your body. Avoiding twisting.   General instructions Maintain a healthy weight. Excess weight puts extra stress on your back. Wear supportive, comfortable shoes. Avoid wearing high heels. Avoid sleeping on a mattress that is too soft or too hard. A mattress that is firm enough to support your back when you sleep may help to reduce your pain. Keep all follow-up visits as told by your health care provider. This is important. Contact a health care provider if: You have pain that: Wakes you up when you are sleeping. Gets worse when you lie down. Is worse than you have experienced in the past. Lasts longer than 4 weeks. You have an unexplained weight loss. Get help right away if: You are not able to control when you urinate or have bowel movements (incontinence). You have: Weakness in your lower back, pelvis, buttocks, or legs that gets worse. Redness or swelling of your back. A burning sensation when you urinate. Summary Sciatica is pain, numbness, weakness, or tingling along the path of the sciatic nerve. This condition is caused by pressure on the sciatic nerve or pinching of the nerve. Sciatica can cause pain, numbness, or tingling in the lower back, legs, hips, and  buttocks. Treatment often includes rest, exercise, medicines, and applying ice or heat. This information is not intended to replace advice given to you by your health care provider. Make sure you discuss any questions you have with your health care provider. Document Revised: 12/01/2018 Document Reviewed: 12/01/2018 Elsevier Patient Education  [place].

## 2021-05-05 ENCOUNTER — Encounter (HOSPITAL_BASED_OUTPATIENT_CLINIC_OR_DEPARTMENT_OTHER): Payer: Self-pay | Admitting: Emergency Medicine

## 2021-05-05 ENCOUNTER — Emergency Department (HOSPITAL_BASED_OUTPATIENT_CLINIC_OR_DEPARTMENT_OTHER)
Admission: EM | Admit: 2021-05-05 | Discharge: 2021-05-05 | Disposition: A | Discharge status: Home / Self Care | Payer: 59 | Attending: Emergency Medicine | Admitting: Emergency Medicine

## 2021-05-05 ENCOUNTER — Telehealth (INDEPENDENT_AMBULATORY_CARE_PROVIDER_SITE_OTHER): Payer: 59 | Admitting: Family

## 2021-05-05 DIAGNOSIS — M5441 Lumbago with sciatica, right side: Secondary | ICD-10-CM

## 2021-05-05 DIAGNOSIS — J45909 Unspecified asthma, uncomplicated: Secondary | ICD-10-CM | POA: Insufficient documentation

## 2021-05-05 DIAGNOSIS — M79651 Pain in right thigh: Secondary | ICD-10-CM | POA: Insufficient documentation

## 2021-05-05 DIAGNOSIS — M5416 Radiculopathy, lumbar region: Secondary | ICD-10-CM | POA: Diagnosis not present

## 2021-05-05 DIAGNOSIS — M545 Low back pain, unspecified: Secondary | ICD-10-CM | POA: Diagnosis present

## 2021-05-05 MED ORDER — OXYCODONE-ACETAMINOPHEN 10-325 MG PO TABS: 1.0000 | ORAL_TABLET | Freq: Four times a day (QID) | ORAL | 0 refills | Status: DC | PRN

## 2021-05-05 MED ORDER — ONDANSETRON 4 MG PO TBDP
8.0000 mg | ORAL_TABLET | Freq: Once | ORAL | Status: AC
  Administered 2021-05-05: 8 mg via ORAL
  Filled 2021-05-05: qty 2

## 2021-05-05 MED ORDER — HYDROMORPHONE HCL 1 MG/ML IJ SOLN
2.0000 mg | Freq: Once | INTRAMUSCULAR | Status: AC
  Administered 2021-05-05: 2 mg via INTRAMUSCULAR
  Filled 2021-05-05: qty 2

## 2021-05-05 NOTE — ED Provider Notes (Signed)
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  He has had some low back pain for about the past 2 weeks.  He saw a chiropractor twice in the last 2 to 3 days and had an adjustment.  This helped the pain in his back but he is now having pain in his right anterior thigh and about the L5 dermatome.  He describes his pain as sharp and intense and rates it as a 10 out of 10.  It varies somewhat with movement of his lower back, and he does not wish to sit down as this exacerbates it.Marland Kitchen  He is having no numbness or weakness.  He is having no bowel or bladder changes.  He has taken Tylenol without relief.  He got a shot of Toradol at his PCP yesterday without any relief.   Past Medical History:  Diagnosis Date   Asthma    childhood asthma    History reviewed. No pertinent surgical history.  Family History  Problem Relation Age of Onset   Diabetes Half-Sister    Cancer Maternal Uncle        prostate   Liver disease Sister    Heart disease Neg Hx    Hypertension Neg Hx    Kidney disease Neg Hx     Social History   Tobacco Use   Smoking status: Never   Smokeless tobacco: Never  Substance Use Topics   Alcohol use: No   Drug use: Never    Prior to Admission medications   Not on File    Allergies Patient has no known allergies.   REVIEW OF SYSTEMS  Negative except as noted here or in the History of Present Illness.   PHYSICAL EXAMINATION  Initial Vital Signs Blood pressure (!) 172/106, pulse 72, temperature 98.1 F (36.7 C), temperature source Oral, resp. rate 18, height 5\' 6"  (1.676 m), weight 91.6 kg, SpO2 98 %.  Examination General: Well-developed, well-nourished male in no acute distress; appearance consistent with age of record HENT: normocephalic;  atraumatic Eyes: Normal appearance Neck: supple Heart: regular rate and rhythm Lungs: clear to auscultation bilaterally Abdomen: soft; nondistended; nontender; bowel sounds present Back: Nontender; no significant change in thigh pain with movement of lower back Extremities: No deformity; full range of motion; pulses normal; no tenderness of right anterior thigh Neurologic: Awake, alert and oriented; motor function intact in all extremities and symmetric; no facial droop Skin: Warm and dry Psychiatric: Pacing   RESULTS  Summary of this visit's results, reviewed and interpreted by myself:   EKG Interpretation  Date/Time:    Ventricular Rate:    PR Interval:    QRS Duration:   QT Interval:    QTC Calculation:   R Axis:     Text Interpretation:          Laboratory Studies: No results found for this or any previous visit (from the past 24 hour(s)). Imaging Studies: No results found.  ED COURSE and MDM  Nursing notes, initial and subsequent vitals signs, including pulse oximetry, reviewed and interpreted by myself.  Vitals:   05/05/21 0346 05/05/21 0347  BP: (!) 172/106   Pulse: 72   Resp: 18   Temp: 98.1 F (36.7 C)   TempSrc: Oral   SpO2: 98%  Weight:  91.6 kg  Height:  5\' 6"  (1.676 m)   Medications  ondansetron (ZOFRAN-ODT) disintegrating tablet 8 mg (has no administration in time range)  HYDROmorphone (DILAUDID) injection 2 mg (has no administration in time range)    The patient's pain is most consistent with an acute L5 lumbar radiculopathy.  There is no tenderness of the right thigh to suggest a local process such as an injured muscle or deep vein thrombosis.  The pain did start initially in his back and the pain varies with movement of his back which would be consistent with a radicular pain.  PROCEDURES  Procedures   ED DIAGNOSES     ICD-10-CM   1. Lumbar back pain with radiculopathy affecting right lower extremity  M54.16          Sullivan Blasing,  Edwyna Dangerfield, MD 05/05/21 0403

## 2021-05-05 NOTE — Progress Notes (Signed)
  Maxwell [name] is a 40 y.o. male with the following history as recorded in EpicCare:  Patient Active Problem List   Diagnosis Date Noted   Lumbar strain 04/21/2021   Routine general medical examination at a health care facility 02/12/2014    Current Outpatient Medications  Medication Sig Dispense Refill   oxyCODONE-acetaminophen (PERCOCET) 10-325 MG tablet Take 1 tablet by mouth every 6 (six) hours as needed for pain. 20 tablet 0   No current facility-administered medications for this visit.    Allergies: Patient has no known allergies.  Past Medical History:  Diagnosis Date   Asthma    childhood asthma    No past surgical history on file.  Family History  Problem Relation Age of Onset   Diabetes Half-Sister    Cancer Maternal Uncle        prostate   Liver disease Sister    Heart disease Neg Hx    Hypertension Neg Hx    Kidney disease Neg Hx     Social History   Tobacco Use   Smoking status: Never   Smokeless tobacco: Never  Substance Use Topics   Alcohol use: No    Subjective:    I connected with Maxwell [name] on 05/05/21 at  2:40 PM EDT by a video enabled telemedicine application and verified that I am speaking with the correct person using two identifiers.   I discussed the limitations of evaluation and management by telemedicine and the availability of in person appointments. The patient expressed understanding and agreed to proceed.  Provider in office/ patient is at home; provider and patient are only 2 people on video call.   Seen yesterday with complaints of severe low back pain; was given shot of Toradol IM in office and prescription for oral steroids; went to the ER this morning with worsening pain- was given shot of Dilaudid and  prescription for Oxycodone; denies any changes in bowel or bladder habits;  Notes that is actually feeling much better at time of virtual visit. He is able to sit down and has been able to rest some. Is on second day of oral steroids and  thinks leg pain may be improved some;     Objective:  There were no vitals filed for this visit.  General: Well developed, well nourished, in no acute distress  Skin : Warm and dry.  Lungs: Respirations unlabored;  Neurologic: Alert and oriented; speech intact; face symmetrical; moves all extremities well; CNII-XII intact without focal deficit   Assessment:  1. Acute right-sided low back pain with right-sided sciatica     Plan:  Symptoms are improved at time of virtual visit; he will continue steroid dose pak as prescribed and can use Oxycodone over the weekend as needed; he will call back on Monday with his response- to consider MRI if sensation in right leg is persisting. He is in agreement with this plan.  No follow-ups on file.  No orders of the defined types were placed in this encounter.   Requested Prescriptions    No prescriptions requested or ordered in this encounter

## 2021-05-05 NOTE — ED Triage Notes (Signed)
Back pain X2 days went to caropractor and had an adjustment after wards pain moved to leg. Seen at PCP given Toradol. Pain worse now.

## 2021-05-09 ENCOUNTER — Encounter: Payer: Self-pay | Admitting: Family

## 2021-05-09 ENCOUNTER — Telehealth (INDEPENDENT_AMBULATORY_CARE_PROVIDER_SITE_OTHER): Payer: 59 | Admitting: Family

## 2021-05-09 ENCOUNTER — Other Ambulatory Visit: Payer: Self-pay

## 2021-05-09 VITALS — Ht 66.0 in | Wt 202.0 lb

## 2021-05-09 DIAGNOSIS — M5441 Lumbago with sciatica, right side: Secondary | ICD-10-CM | POA: Diagnosis not present

## 2021-05-09 MED ORDER — GABAPENTIN 100 MG PO CAPS: 100.0000 mg | ORAL_CAPSULE | Freq: Every day | ORAL | 0 refills | Status: DC

## 2021-05-09 NOTE — Progress Notes (Signed)
  Maxwell [name] is a 40 y.o. male with the following history as recorded in Epic[employer]:  Patient Active Problem List   Diagnosis Date Noted   Lumbar strain 04/21/2021   Routine general medical examination at a health care facility 02/12/2014    Current Outpatient Medications  Medication Sig Dispense Refill   gabapentin (NEURONTIN) 100 MG capsule Take 1 capsule (100 mg total) by mouth at bedtime. 90 capsule 0   oxyCODONE-acetaminophen (PERCOCET) 10-325 MG tablet Take 1 tablet by mouth every 6 (six) hours as needed for pain. (Patient not taking: Reported on 05/09/2021) 20 tablet 0   No current facility-administered medications for this visit.    Allergies: Patient has no known allergies.  Past Medical History:  Diagnosis Date   Asthma    childhood asthma    No past surgical history on file.  Family History  Problem Relation Age of Onset   Diabetes Half-Sister    Cancer Maternal Uncle        prostate   Liver disease Sister    Heart disease Neg Hx    Hypertension Neg Hx    Kidney disease Neg Hx     Social History   Tobacco Use   Smoking status: Never   Smokeless tobacco: Never  Substance Use Topics   Alcohol use: No    Subjective:  I connected with [name] on 05/09/21 at  1:40 PM EDT by a video enabled telemedicine application and verified that I am speaking with the correct person using two identifiers.   I discussed the limitations of evaluation and management by telemedicine and the availability of in person appointments. The patient expressed understanding and agreed to proceed.  Provider in office/ patient is at home; provider and patient are only 2 people on video call.   Follow up on low back pain with radiculopathy; has now completed steroid pack and using less pain medicine; however, continuing to feel numb sensation in right leg; no bowel or bladder changes;    Objective:  Vitals:   05/09/21 1326  Weight: 202 lb (91.6 kg)  Height: 5\' 6"  (1.676 m)    General: Well  developed, well nourished, in no acute distress  Skin : Warm and dry.  Head: Normocephalic and atraumatic  Lungs: Respirations unlabored;  Neurologic: Alert and oriented; speech intact; face symmetrical;   Assessment:  1. Acute right-sided low back pain with right-sided sciatica     Plan:  Trial of Gabapentin 100 mg qhs; refer to sports medicine for further evaluation and discussion of treatment options;   No follow-ups on file.  No orders of the defined types were placed in this encounter.   Requested Prescriptions   Signed Prescriptions Disp Refills   gabapentin (NEURONTIN) 100 MG capsule 90 capsule 0    Sig: Take 1 capsule (100 mg total) by mouth at bedtime.

## 2021-05-11 ENCOUNTER — Telehealth: Payer: Self-pay | Admitting: Family

## 2021-05-11 NOTE — Telephone Encounter (Signed)
Patient states he would like to speak to Mickel Baas in regards to his visit on Tuesday. He would not elaborate.

## 2021-05-11 NOTE — Progress Notes (Signed)
[place] Phone: [phone] Subjective:   [name], am serving as a scribe for Dr. Hulan Saas. This visit occurred during the SARS-CoV-2 public health emergency.  Safety protocols were in place, including screening questions prior to the visit, additional usage of staff PPE, and extensive cleaning of exam room while observing appropriate contact time as indicated for disinfecting solutions.   I'm seeing this patient by the request  of:  [name], NP  CC: low back pain   [id number]  Sayed Thoma is a 40 y.o. male coming in with complaint of back pain. Patient was given toradol, oxycodone at ED. Has seen PCP and was put on gabapentin. Patient states that he has had some chiropractic care and this caused pain in the right leg. Pain got worse and he went into ED. Pain in quad in now numbness. Believes that originally he may have injured his back one month ago after doing some rows with 70# DB. Patient has not had to use Percocet in one week. Has been taking gabapentin for 2 nights and this did not help the numbness. No history of back injuries or surgery.      Past Medical History:  Diagnosis Date   Asthma    childhood asthma   No past surgical history on file. 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   Social Determinants of Health   Financial Resource  Strain: Not on file  Food Insecurity: Not on file  Transportation Needs: Not on file  Physical Activity: Not on file  Stress: Not on file  Social Connections: Not on file   No Known Allergies Family History  Problem Relation Age of Onset   Diabetes Half-Sister    Cancer Maternal Uncle        prostate   Liver disease Sister    Heart disease Neg Hx    Hypertension Neg Hx    Kidney disease Neg Hx        Current Outpatient Medications (Analgesics):    meloxicam (MOBIC) 7.5 MG tablet, Take 1 tablet (7.5 mg total) by mouth daily.   oxyCODONE-acetaminophen (PERCOCET) 10-325 MG tablet, Take 1 tablet by mouth every 6 (six) hours as needed for pain. (Patient not taking: Reported on 05/09/2021)   Current Outpatient Medications (Other):    gabapentin (NEURONTIN) 100 MG capsule, Take 1 capsule (100 mg total) by mouth at bedtime.   Reviewed prior external information including notes and imaging from  primary care provider As well as notes that were available from care everywhere and other healthcare systems.  Past medical history, social, surgical and family history all reviewed in electronic medical record.  No pertanent information unless stated regarding to the chief complaint.   Review of Systems:  No headache, visual changes, nausea, vomiting, diarrhea, constipation, dizziness, abdominal pain, skin rash, fevers, chills, night sweats, weight loss, swollen lymph nodes, body aches, joint swelling, chest pain, shortness of breath, mood changes. POSITIVE muscle aches  Objective  Blood pressure (!) 132/98, pulse 65, height 5\' 6"  (1.676 m), weight 200 lb (90.7 kg), SpO2 98 %.   General: No apparent distress alert and oriented x3 mood and affect normal, dressed appropriately.  HEENT: Pupils equal, extraocular movements intact  Respiratory: Patient's speak in full sentences and does not appear short of breath  Cardiovascular: No lower extremity edema, non tender, no erythema  Gait normal  with good balance and coordination.  MSK:  Non tender with full range of motion and good stability and symmetric strength and tone of shoulders, elbows, wrist, hip, knee and ankles bilaterally.  Low back exam does have some mild loss of lordosis.  Pelvic shear noted on the right side.  Patient does have tightness of the right sacroiliac joint.  Negative straight leg test but patient does have tenderness to palpation in the paraspinal musculature of the lumbar spine mostly around L2 and L3.  Patient does have a negative straight leg.  5 out of 5 strength of the lower extremity  [id number]; 15 additional minutes spent for Therapeutic exercises as stated in above notes.  This included exercises focusing on stretching, strengthening, with significant focus on eccentric aspects.   Long term goals include an improvement in range of motion, strength, endurance as well as avoiding reinjury. Patient's frequency would include in 1-2 times a day, 3-5 times a week for a duration of 6-12 weeks.  Low back exercises that included:  Pelvic tilt/bracing instruction to focus on control of the pelvic girdle and lower abdominal muscles  Glute strengthening exercises, focusing on proper firing of the glutes without engaging the low back muscles Proper stretching techniques for maximum relief for the hamstrings, hip flexors, low back and some rotation where tolerated Proper technique shown and discussed handout in great detail with ATC.  All questions were discussed and answered.     Impression and Recommendations:     The above documentation has been reviewed and is accurate and complete Lyndal Pulley, DO

## 2021-05-12 ENCOUNTER — Ambulatory Visit (INDEPENDENT_AMBULATORY_CARE_PROVIDER_SITE_OTHER): Payer: 59

## 2021-05-12 ENCOUNTER — Ambulatory Visit (INDEPENDENT_AMBULATORY_CARE_PROVIDER_SITE_OTHER): Payer: 59 | Admitting: Family Medicine

## 2021-05-12 ENCOUNTER — Encounter: Payer: Self-pay | Admitting: Family Medicine

## 2021-05-12 ENCOUNTER — Other Ambulatory Visit: Payer: Self-pay

## 2021-05-12 VITALS — BP 132/98 | HR 65 | Ht 66.0 in | Wt 200.0 lb

## 2021-05-12 DIAGNOSIS — S39012D Strain of muscle, fascia and tendon of lower back, subsequent encounter: Secondary | ICD-10-CM | POA: Diagnosis not present

## 2021-05-12 DIAGNOSIS — M545 Low back pain, unspecified: Secondary | ICD-10-CM

## 2021-05-12 DIAGNOSIS — M25551 Pain in right hip: Secondary | ICD-10-CM | POA: Diagnosis not present

## 2021-05-12 MED ORDER — MELOXICAM 7.5 MG PO TABS: 7.5000 mg | ORAL_TABLET | Freq: Every day | ORAL | 0 refills | Status: DC

## 2021-05-12 NOTE — Assessment & Plan Note (Signed)
Patient does have signs and symptoms consistent with more of a lumbar strain.  Attempted osteopathic manipulation work of the thigh at this point.  Patient does have a 100 mg at night and given more of the meloxicam to do a 10-day burst.  We will start with formal physical therapy.  I believe the patient has good strength of the legs and will get x-rays to further evaluate but do believe patient will do well with conservative therapy.  Follow-up with me again 4 to 6 weeks at that time if doing better can consider starting osteopathic manipulation.

## 2021-05-12 NOTE — Patient Instructions (Signed)
PT [facility] will call you Xray today Meloxicam 7.5mg  daily for 10 days then as needed Do not use NSAIDS such as Advil or Aleve when taking Meloxicam It is ok to use Tylenol for additional pain relief  See me again in 4-6 weeks

## 2021-05-12 NOTE — Telephone Encounter (Signed)
I have called pt back and ensured that he didn't have any additional questions. He did just have an appointment with Dr. Tamala Julian, sports med doc. Pt reports that his questions have been answered.

## 2021-05-16 ENCOUNTER — Ambulatory Visit: Payer: 59 | Admitting: Family Medicine

## 2021-05-17 ENCOUNTER — Ambulatory Visit: Payer: 59 | Admitting: Family Medicine

## 2021-05-31 ENCOUNTER — Ambulatory Visit: Payer: 59

## 2021-06-02 ENCOUNTER — Ambulatory Visit: Payer: Self-pay | Admitting: Physical Therapy

## 2021-06-21 NOTE — Progress Notes (Signed)
[facility] [place] Phone: [phone] Subjective:   Maxwell [name], am serving as a scribe for Dr. Hulan Saas.  This visit occurred during the SARS-CoV-2 public health emergency.  Safety protocols were in place, including screening questions prior to the visit, additional usage of staff PPE, and extensive cleaning of exam room while observing appropriate contact time as indicated for disinfecting solutions.   I'm seeing this patient by the request  of:  [name], NP  CC: low back pain   [id number]  05/12/2021 Patient does have signs and symptoms consistent with more of a lumbar strain.  Attempted osteopathic manipulation work of the thigh at this point.  Patient does have a 100 mg at night and given more of the meloxicam to do a 10-day burst.  We will start with formal physical therapy.  I believe the patient has good strength of the legs and will get x-rays to further evaluate but do believe patient will do well with conservative therapy.  Follow-up with me again 4 to 6 weeks at that time if doing better can consider starting osteopathic manipulation.  Update 06/22/2021 Maxwell [name] is a 40 y.o. male coming in with complaint of LBP. Patient states that he does not have full sensation in R quad. Patient said that after last visit he felt better after OMT. [name] diff when taking meloxicam. Doing light workouts with only minimal soreness in R hip.   Xray Lumbar 05/12/2021 IMPRESSION: Disc space narrowing at L5-S1. Other disc spaces appear unremarkable. [name] fracture or spondylolisthesis.    Past Medical History:  Diagnosis Date   Asthma    childhood asthma   [name] past surgical history on file. 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   Social Determinants of Health   Financial Resource Strain: Not on file  Food Insecurity: Not on file  Transportation Needs: Not on file  Physical Activity: Not on file  Stress: Not on file  Social Connections: Not on file   [name] Known Allergies Family History  Problem Relation Age of Onset   Diabetes Half-Sister    Cancer Maternal Uncle        prostate   Liver disease Sister    Heart disease Neg Hx    Hypertension Neg Hx    Kidney disease Neg Hx        Current Outpatient Medications (Analgesics):    meloxicam (MOBIC) 7.5 MG tablet, Take 1 tablet (7.5 mg total) by mouth daily.   oxyCODONE-acetaminophen (PERCOCET) 10-325 MG tablet, Take 1 tablet by mouth every 6 (six) hours as needed for pain.   Current Outpatient Medications (Other):    gabapentin (NEURONTIN) 100 MG capsule, Take 1 capsule (100 mg total) by mouth at bedtime.  Reviewed prior external information including notes and imaging from  primary care provider As well as notes that were available from care everywhere and other healthcare systems.  Past medical history, social, surgical and family history all reviewed in electronic medical record.  [name] pertanent information unless stated regarding to the chief complaint.   Review of Systems:  [name] headache, visual changes, nausea, vomiting, diarrhea, constipation, dizziness, abdominal pain, skin rash, fevers, chills, night sweats, weight loss, swollen lymph nodes, body aches, joint swelling, chest pain, shortness of breath, mood changes. POSITIVE muscle aches  Objective  Blood pressure (!) 132/94, pulse  72, height '5\' 6"'$  (1.676 m), weight 203 lb (92.1 kg), SpO2 98 %.   General: [name] apparent distress alert and oriented x3 mood and affect normal, dressed appropriately.  HEENT: Pupils equal, extraocular movements intact  Respiratory: Patient's speak in full sentences and does not appear short of breath  Cardiovascular: [name] lower extremity edema, non tender, [name] erythema  Gait normal with good balance and coordination.  MSK:  Non tender with full range of motion and good stability and symmetric strength and tone of shoulders, elbows, wrist, hip, knee and ankles bilaterally.  Low back pain exam shows patient does have some mild discomfort noted in the right sacroiliac joint patient does have mild tightness with FABER test 5-5 strength in lower extremities otherwise.  Osteopathic findings T8 extended rotated and side bent left L1 flexed rotated and side bent right Sacrum right on right    Impression and Recommendations:     The above documentation has been reviewed and is accurate and complete Maxwell Pulley, DO

## 2021-06-22 ENCOUNTER — Encounter: Payer: Self-pay | Admitting: Family Medicine

## 2021-06-22 ENCOUNTER — Other Ambulatory Visit: Payer: Self-pay

## 2021-06-22 ENCOUNTER — Ambulatory Visit (INDEPENDENT_AMBULATORY_CARE_PROVIDER_SITE_OTHER): Payer: 59 | Admitting: Family Medicine

## 2021-06-22 VITALS — BP 132/94 | HR 72 | Ht 66.0 in | Wt 203.0 lb

## 2021-06-22 DIAGNOSIS — M9902 Segmental and somatic dysfunction of thoracic region: Secondary | ICD-10-CM | POA: Diagnosis not present

## 2021-06-22 DIAGNOSIS — M9904 Segmental and somatic dysfunction of sacral region: Secondary | ICD-10-CM | POA: Diagnosis not present

## 2021-06-22 DIAGNOSIS — S39012D Strain of muscle, fascia and tendon of lower back, subsequent encounter: Secondary | ICD-10-CM | POA: Diagnosis not present

## 2021-06-22 DIAGNOSIS — M9903 Segmental and somatic dysfunction of lumbar region: Secondary | ICD-10-CM

## 2021-06-22 MED ORDER — GABAPENTIN 100 MG PO CAPS: 100.0000 mg | ORAL_CAPSULE | Freq: Every day | ORAL | 0 refills | Status: DC

## 2021-06-22 NOTE — Assessment & Plan Note (Signed)

## 2021-06-22 NOTE — Patient Instructions (Signed)
Great to see you  You already are a lot better keep it up  Keep up the exercises at least 2 times a week PT 1-2 times then you will be good on your own  Refilled the gabapentin to take at night as needed I think manipulation is good for you  See me again in 8 weeks

## 2021-06-22 NOTE — Assessment & Plan Note (Signed)
Patient is doing much better overall.  Does respond well to osteopathic manipulation.  Discussed posture and ergonomics otherwise.  Discussed potential small amount of weight loss that could also be beneficial.  Patient given a refill of the gabapentin to have on hand.  Patient is doing better overall and will follow up with me again 2 months

## 2021-06-23 ENCOUNTER — Ambulatory Visit: Payer: 59 | Attending: Family Medicine

## 2021-06-23 DIAGNOSIS — M545 Low back pain, unspecified: Secondary | ICD-10-CM | POA: Insufficient documentation

## 2021-06-23 DIAGNOSIS — R29898 Other symptoms and signs involving the musculoskeletal system: Secondary | ICD-10-CM | POA: Diagnosis present

## 2021-06-23 NOTE — Therapy (Signed)
[place], [place], [address] Phone: [phone]   Fax:  [phone]  Physical Therapy Evaluation/Discharge  Patient Details  Name: Maxwell [name] MRN: [id number] Date of Birth: 08/10/1981 Referring Provider (PT): Lyndal Pulley, DO   Encounter Date: 06/23/2021   PT End of Session - 06/23/21 0930     Visit Number 1    Number of Visits 1    Authorization Type Self pay    PT Start Time 0800    PT Stop Time 0900    PT Time Calculation (min) 60 min    Activity Tolerance Patient tolerated treatment well    Behavior During Therapy [facility] for tasks assessed/performed             Past Medical History:  Diagnosis Date   Asthma    childhood asthma    History reviewed. No pertinent surgical history.  There were no vitals filed for this visit.    Subjective Assessment - 06/23/21 0806     Subjective Pt reports developing R low back pain after a hard work out with weights. Pt states his pain is gradually improving since the incident. Currently he has r laterl low back pain and R thigh numbness which are both intermittent. Pt stated he wanted to attend just 1 session for exs to help his back. Pt notes he is continuing to work out, but at a lower level.    Limitations Lifting    Currently in Pain? No/denies    Pain Score 0-No pain   2/10 when it occurs   Pain Location Back    Pain Orientation Right;Posterior;Lower;Lateral    Pain Descriptors / Indicators Aching    Pain Type Chronic pain    Pain Onset More than a month ago    Pain Frequency Occasional                [facility] PT Assessment - 06/23/21 0001       Assessment   Medical Diagnosis Lumbar spine pain    Referring Provider (PT) Lyndal Pulley, DO    Onset Date/Surgical Date --   approx 2 months ago   Hand Dominance Right    Prior Therapy no PT, chiropratic care      Precautions   Precautions None      Restrictions   Weight Bearing  Restrictions Yes      Balance Screen   Has the patient fallen in the past 6 months No      Prior Function   Level of Independence Independent    Vocation Full time employment    Vocation Requirements IT- Office work      Observation/Other Assessments   Focus on Therapeutic Outcomes (FOTO)  NA-Eval only      Sensation   Light Touch Appears Intact      Posture/Postural Control   Posture/Postural Control No significant limitations      ROM / Strength   AROM / PROM / Strength AROM;Strength      AROM   Overall AROM Comments Trunk AROMs were found to be WNLs      Strength   Overall Strength Comments Myotomal screen for LEs was negative      Flexibility   Soft Tissue Assessment /Muscle Length yes    Hamstrings Tight bilaterally, noted with forward bending      Palpation   Palpation comment TTP R lateral low back      Special Tests    Special Tests  Lumbar    Lumbar Tests Slump Test      Slump test   Findings Negative      Transfers   Transfers Sit to Stand;Stand to Sit    Sit to Stand 7: Independent      Ambulation/Gait   Gait Pattern Within Functional Limits;Step-through pattern                        Objective measurements completed on examination: See above findings.               PT Education - 06/23/21 0928     Education Details Eval findings, POC, HEP, recommendations to positioning with sitting and sleeping to minimize low back pain, use of cold or heat for pain management.    Person(s) Educated Patient    Methods Explanation    Comprehension Verbalized understanding;Returned demonstration;Verbal cues required;Tactile cues required              PT Short Term Goals - 06/23/21 1224       PT SHORT TERM GOAL #1   Title STGs=LTGs               PT Long Term Goals - 06/23/21 1224       PT LONG TERM GOAL #1   Title Pt will be Ind in a HEP to promote flexibility and strength for a healthy back    Status Achieved     Target Date 06/23/21                    Plan - 06/23/21 1211     Clinical Impression Statement Pt presents to PT following a low back injury approx. 2 months ago which is improving with occasional low level pain. Pt was instructed in a HEP for lumbpelvic flexibility and strengthening c a flexion bias for flexibility, symptom management, and sitting and sleeping positions for comfort. Pt returned demonstration of provided exs with proper technique. Pt is to complete strengthening exs everyother day and flexibilty exs daily as needed. A written program was provided. Pt is to DC es if they increase pain. Pt was only seen for 1 visit per his request. Pt may request another referral to PT from his MD per need in the future.    Personal Factors and Comorbidities Time since onset of injury/illness/exacerbation;Past/Current Experience    Stability/Clinical Decision Making Stable/Uncomplicated    Clinical Decision Making Low    Rehab Potential Excellent    PT Frequency 1x / week    PT Treatment/Interventions Therapeutic exercise;Therapeutic activities    PT Next Visit Plan NA    PT Home Exercise Plan [id number]    Consulted and Agree with Plan of Care Patient             Patient will benefit from skilled therapeutic intervention in order to improve the following deficits and impairments:  Pain, Impaired flexibility  Visit Diagnosis: Lumbar spine pain  Impaired flexibility of lower extremity     Problem List Patient Active Problem List   Diagnosis Date Noted   Somatic dysfunction of spine, lumbar 06/22/2021   Lumbar strain 04/21/2021   Routine general medical examination at a health care facility 02/12/2014   PHYSICAL THERAPY DISCHARGE SUMMARY  Visits from Start of Care: 1 per pt's request  Current functional level related to goals / functional outcomes: See above   Remaining deficits: See above   Education / Equipment: HEP   Patient agrees to discharge.  Patient  goals were met. Patient is being discharged due to being pleased with the current functional level.   Gar Ponto MS, PT 06/23/21 12:32 PM   [facility] [address] [place], [place], [address] Phone: [phone]   Fax:  [phone]  Name: Maxwell [name] MRN: [id number] Date of Birth: 12/24/1980

## 2021-06-27 ENCOUNTER — Other Ambulatory Visit (HOSPITAL_BASED_OUTPATIENT_CLINIC_OR_DEPARTMENT_OTHER): Payer: Self-pay

## 2021-06-27 ENCOUNTER — Ambulatory Visit: Payer: 59 | Attending: Internal Medicine

## 2021-06-27 DIAGNOSIS — Z23 Encounter for immunization: Secondary | ICD-10-CM

## 2021-06-27 MED ORDER — COVID-19 MRNA VACC (MODERNA) 100 MCG/0.5ML IM SUSP
INTRAMUSCULAR | 0 refills | Status: DC
  Filled 2021-06-27: qty 0.25, 1d supply, fill #0

## 2021-06-27 NOTE — Progress Notes (Signed)
   Covid-19 Vaccination Clinic  Name:  Maxwell [name]    MRN: [id number] DOB: May 20, 1981  06/27/2021  [name] was observed post Covid-19 immunization for 15 minutes without incident. He was provided with Vaccine Information Sheet and instruction to access the V-Safe system.   [name] was instructed to call 911 with any severe reactions post vaccine: Difficulty breathing  Swelling of face and throat  A fast heartbeat  A bad rash all over body  Dizziness and weakness   Immunizations Administered     Name Date Dose VIS Date Route   Moderna Covid-19 Booster Vaccine 06/27/2021 11:30 AM 0.25 mL 09/14/2020 Intramuscular   Manufacturer: Moderna   Lot: [id number]   [id number]

## 2021-08-17 ENCOUNTER — Ambulatory Visit: Payer: 59 | Admitting: Family Medicine

## 2021-08-17 NOTE — Progress Notes (Deleted)
  [place] Phone: [phone] Subjective:    I'm seeing this patient by the request  of:  [name], NP  CC:   [id number]  Maxwell [name] is a 40 y.o. male coming in with complaint of back and neck pain. OMT on 06/22/2021. Patient states   Medications patient has been prescribed: Gabapentin  Taking:         Reviewed prior external information including notes and imaging from previsou exam, outside providers and external EMR if available.   As well as notes that were available from care everywhere and other healthcare systems.  Past medical history, social, surgical and family history all reviewed in electronic medical record.  No pertanent information unless stated regarding to the chief complaint.   Past Medical History:  Diagnosis Date   Asthma    childhood asthma    No Known Allergies   Review of Systems:  No headache, visual changes, nausea, vomiting, diarrhea, constipation, dizziness, abdominal pain, skin rash, fevers, chills, night sweats, weight loss, swollen lymph nodes, body aches, joint swelling, chest pain, shortness of breath, mood changes. POSITIVE muscle aches  Objective  There were no vitals taken for this visit.   General: No apparent distress alert and oriented x3 mood and affect normal, dressed appropriately.  HEENT: Pupils equal, extraocular movements intact  Respiratory: Patient's speak in full sentences and does not appear short of breath  Cardiovascular: No lower extremity edema, non tender, no erythema  Neuro: Cranial nerves II through XII are intact, neurovascularly intact in all extremities with 2+ DTRs and 2+ pulses.  Gait normal with good balance and coordination.  MSK:  Non tender with full range of motion and good stability and symmetric strength and tone of shoulders, elbows, wrist, hip, knee and ankles bilaterally.  Back - Normal skin, Spine with normal  alignment and no deformity.  No tenderness to vertebral process palpation.  Paraspinous muscles are not tender and without spasm.   Range of motion is full at neck and lumbar sacral regions  Osteopathic findings  C2 flexed rotated and side bent right C6 flexed rotated and side bent left T3 extended rotated and side bent right inhaled rib T9 extended rotated and side bent left L2 flexed rotated and side bent right Sacrum right on right       Assessment and Plan:    Nonallopathic problems  Decision today to treat with OMT was based on Physical Exam  After verbal consent patient was treated with HVLA, ME, FPR techniques in cervical, rib, thoracic, lumbar, and sacral  areas  Patient tolerated the procedure well with improvement in symptoms  Patient given exercises, stretches and lifestyle modifications  See medications in patient instructions if given  Patient will follow up in 4-8 weeks      The above documentation has been reviewed and is accurate and complete Lyndal Pulley, DO       Note: This dictation was prepared with Dragon dictation along with smaller phrase technology. Any transcriptional errors that result from this process are unintentional.

## 2021-08-29 IMAGING — DX DG LUMBAR SPINE 2-3V
3 series · 3 of 3 positions shown · non-contrast
Comparison: None

CLINICAL DATA: Low back pain

EXAM:
LUMBAR SPINE - 2-3 VIEW

[l-spine ap]
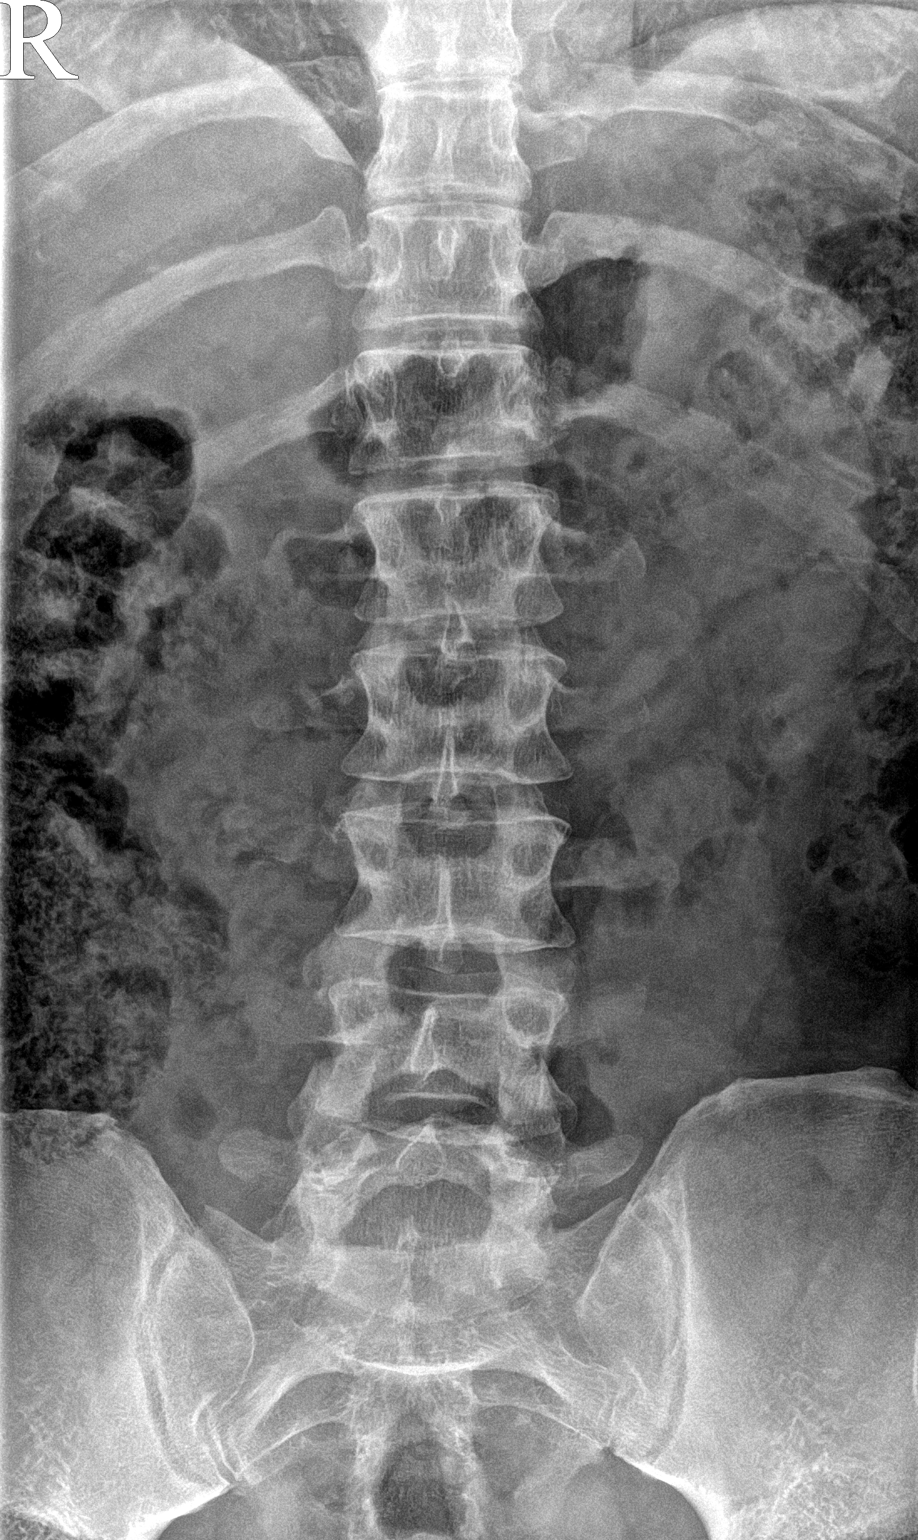

[l-spine lateral (1 of 2)]
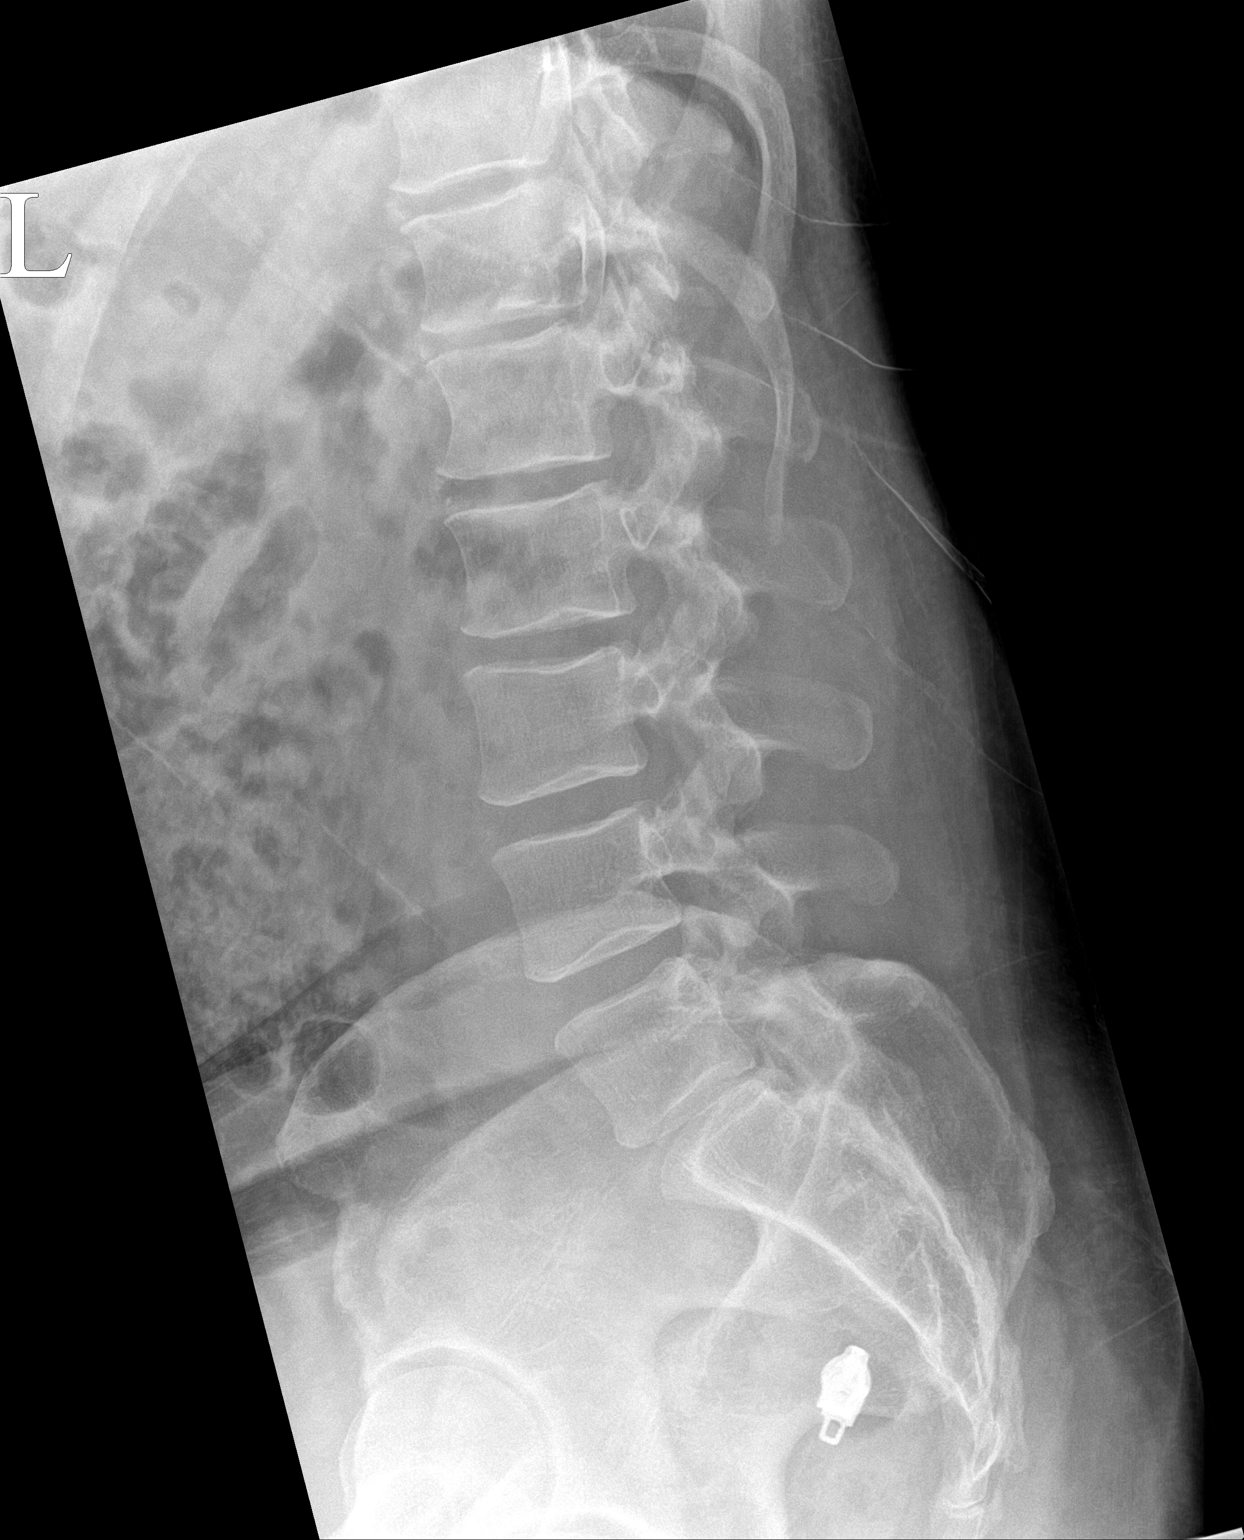

[l-spine lateral (2 of 2)]
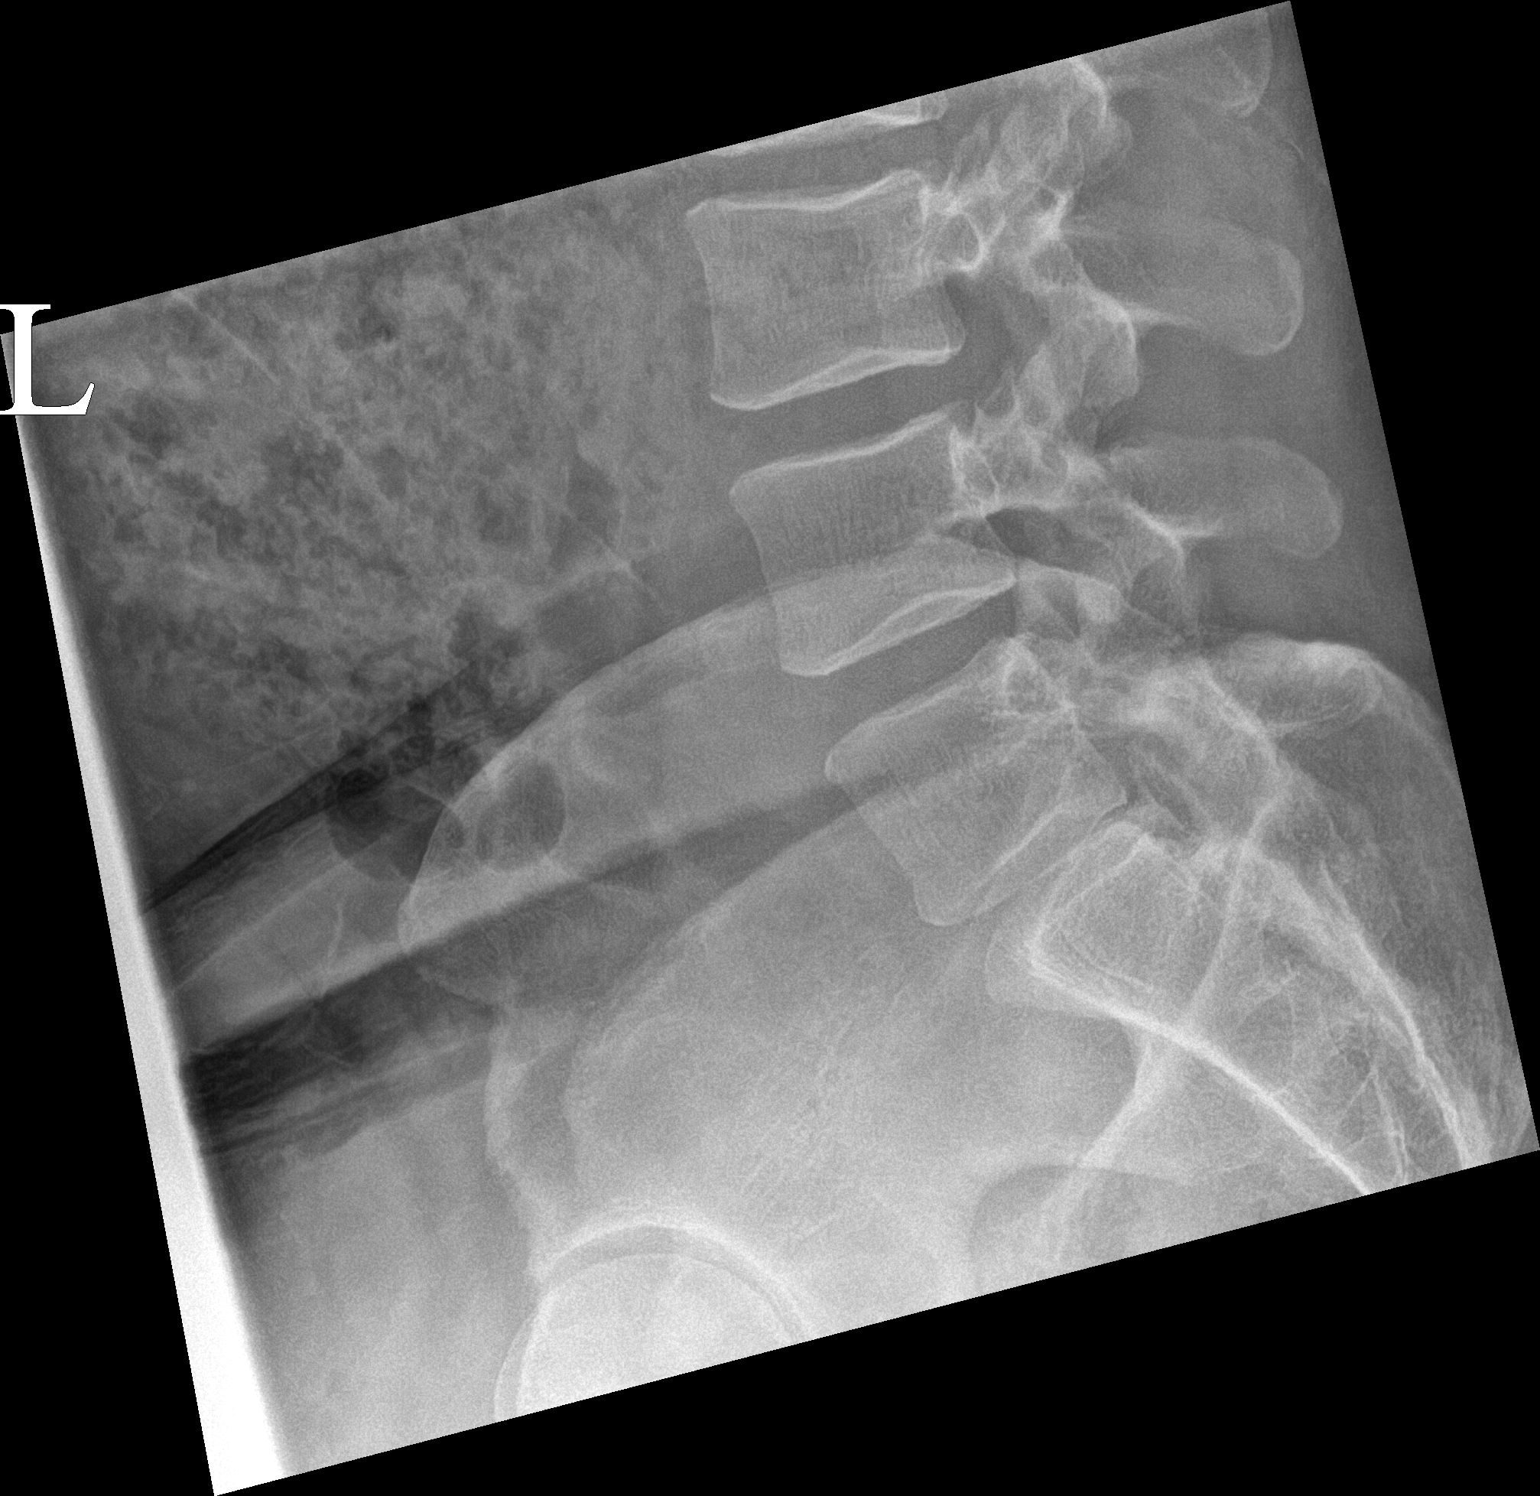

[3 of 3 positions shown; findings below may reference images not displayed]

FINDINGS: Frontal, lateral, and spot lumbosacral lateral images were obtained.
There are 5 non-rib-bearing lumbar type vertebral bodies. There is
no fracture or spondylolisthesis. There is moderate disc space
narrowing at L5-S1. Other disc spaces appear unremarkable. No
appreciable facet arthropathy.
IMPRESSION: Disc space narrowing at L5-S1. Other disc spaces appear
unremarkable. No fracture or spondylolisthesis.

## 2021-10-03 ENCOUNTER — Ambulatory Visit: Payer: 59 | Admitting: Family Medicine

## 2021-10-25 NOTE — Progress Notes (Signed)
   Maxwell [name] D.[place] Phone: [phone]   Assessment and Plan:     1. Acute bilateral low back pain without sciatica -Acute, uncomplicated, initial visit - Likely acute muscle spasms from patient sleeping on couch - Continue exercises as tolerated - We will refill meloxicam and patient may use meloxicam 7.5 mg daily for 1 to 2 weeks and then remainder as needed - No red flag symptoms, so no additional imaging today. - Patient has received significant relief with OMT in the past.  Elects for repeat OMT today.  Tolerated well per note below. - Decision today to treat with OMT was based on Physical Exam   After verbal consent patient was treated with HVLA (high velocity low amplitude), ME (muscle energy), FPR (flex positional release), ST (soft tissue), PC/PD (Pelvic Compression/ Pelvic Decompression) techniques in thoracic, lumbar, and pelvic areas. Patient tolerated the procedure well with improvement in symptoms.  Patient educated on potential side effects of soreness and recommended to rest, hydrate, and use Tylenol as needed for pain control.   Pertinent previous records reviewed include none   Follow Up: 2-week follow-up for repeat OMT prior to patient's vacation    Subjective:   I, Maxwell [name], am serving as a scribe for Dr. Glennon [name]  Chief Complaint: Low back pain   HPI:   10/26/2021 Patient is a 39 year old male with low back pain. Patient last saw Doctor Maxwell Julian on 06/22/2021 for this reason and had OMT. Today patient states that he is a little sore in his low back but more of a maintenance OMT today.   Relevant Historical Information: None pertinent  Additional pertinent review of systems negative.  Current Outpatient Medications  Medication Sig Dispense Refill   COVID-19 mRNA vaccine, Moderna, 100 MCG/0.5ML injection Inject into the muscle. (Patient not taking: Reported on 10/26/2021) 0.25 mL 0    gabapentin (NEURONTIN) 100 MG capsule Take 1 capsule (100 mg total) by mouth at bedtime. (Patient not taking: Reported on 10/26/2021) 90 capsule 0   meloxicam (MOBIC) 7.5 MG tablet Take 1 tablet (7.5 mg total) by mouth daily. 30 tablet 0   oxyCODONE-acetaminophen (PERCOCET) 10-325 MG tablet Take 1 tablet by mouth every 6 (six) hours as needed for pain. (Patient not taking: Reported on 10/26/2021) 20 tablet 0   No current facility-administered medications for this visit.      Objective:     Vitals:   10/26/21 1008  BP: 120/80  Pulse: 64  SpO2: 99%  Weight: 205 lb (93 kg)  Height: 5\' 6"  (1.676 m)      Body mass index is 33.09 kg/m.    Physical Exam:     General: Well-appearing, cooperative, sitting comfortably in no acute distress.   OMT Physical Exam:  ASIS Compression Test: Positive Right Thoracic: TTP paraspinal, T8-12 RLSR Lumbar: TTP paraspinal, L5 RR Pelvis: Right anterior innominate  Electronically signed by:  Maxwell [name] D.[facility] 11:19 AM 10/26/21

## 2021-10-26 ENCOUNTER — Other Ambulatory Visit: Payer: Self-pay

## 2021-10-26 ENCOUNTER — Ambulatory Visit (INDEPENDENT_AMBULATORY_CARE_PROVIDER_SITE_OTHER): Payer: 59 | Admitting: Sports Medicine

## 2021-10-26 VITALS — BP 120/80 | HR 64 | Ht 66.0 in | Wt 205.0 lb

## 2021-10-26 DIAGNOSIS — M9902 Segmental and somatic dysfunction of thoracic region: Secondary | ICD-10-CM | POA: Diagnosis not present

## 2021-10-26 DIAGNOSIS — M9905 Segmental and somatic dysfunction of pelvic region: Secondary | ICD-10-CM

## 2021-10-26 DIAGNOSIS — M545 Low back pain, unspecified: Secondary | ICD-10-CM | POA: Diagnosis not present

## 2021-10-26 DIAGNOSIS — M9903 Segmental and somatic dysfunction of lumbar region: Secondary | ICD-10-CM | POA: Diagnosis not present

## 2021-10-26 MED ORDER — MELOXICAM 7.5 MG PO TABS: 7.5000 mg | ORAL_TABLET | Freq: Every day | ORAL | 0 refills | Status: DC

## 2021-10-26 NOTE — Patient Instructions (Addendum)
Good to see you Refill sent of meloxicam to take daily as needed

## 2021-11-07 NOTE — Progress Notes (Signed)
Maxwell [name] [address] [place] Phone: [phone] Subjective:   Maxwell [name], am serving as a scribe for Dr. Hulan Saas. This visit occurred during the SARS-CoV-2 public health emergency.  Safety protocols were in place, including screening questions prior to the visit, additional usage of staff PPE, and extensive cleaning of exam room while observing appropriate contact time as indicated for disinfecting solutions.   I'm seeing this patient by the request  of:  [name], NP  CC: Back and neck pain follow-up  [id number]  Maxwell [name] is a 40 y.o. male coming in with complaint of back and neck pain. OMT on 10/26/2021 with Dr. Glennon Mac.  Patient has taken meloxicam in the past.  Did not feel that the gabapentin made significant difference.  Reviewing patient's chart patient went to physical therapy 1 time in July and has been lost to follow-up.  Saw another provider December 1 and diagnosed more with muscle spasms.  Started the meloxicam.  Patient states not sure about manipulation. Pain levels about the same. No relief from last week.  Medications patient has been prescribed: Meloxicam  Taking: Intermittently         Reviewed prior external information including notes and imaging from previsou exam, outside providers and external EMR if available.   As well as notes that were available from care everywhere and other healthcare systems.  Past medical history, social, surgical and family history all reviewed in electronic medical record.  No pertanent information unless stated regarding to the chief complaint.   Past Medical History:  Diagnosis Date   Asthma    childhood asthma    No Known Allergies   Review of Systems:  No headache, visual changes, nausea, vomiting, diarrhea, constipation, dizziness, abdominal pain, skin rash, fevers, chills, night sweats, weight loss, swollen lymph nodes, body aches, joint  swelling, chest pain, shortness of breath, mood changes. POSITIVE muscle aches  Objective  Blood pressure (!) 130/96, pulse 61, height 5\' 6"  (1.676 m), weight 210 lb (95.3 kg), SpO2 98 %.   General: No apparent distress alert and oriented x3 mood and affect normal, dressed appropriately.  HEENT: Pupils equal, extraocular movements intact  Respiratory: Patient's speak in full sentences and does not appear short of breath  Cardiovascular: No lower extremity edema, non tender, no erythema  Low back exam does have some very mild tightness more around the right sacroiliac joint.  Mild positive FABER test on the right side.  Lacks last 5 degrees of extension.  Osteopathic findings   L2 flexed rotated and side bent right Sacrum right on right       Assessment and Plan:  Lumbar strain Patient did have a strain initially.  Responded extremely well to the osteopathic manipulation previously attempted and again today.  Patient has meloxicam.  Do feel the patient can follow-up in 45-month intervals if necessary.  Otherwise patient can follow-up as needed as well.   Nonallopathic problems  Decision today to treat with OMT was based on Physical Exam  After verbal consent patient was treated with HVLA, ME, FPR techniques in  thoracic, lumbar, and sacral  areas  Patient tolerated the procedure well with improvement in symptoms  Patient given exercises, stretches and lifestyle modifications  See medications in patient instructions if given  Patient will follow up in 12 weeks     The above documentation has been reviewed and is accurate and complete Lyndal Pulley, DO  Note: This dictation was prepared with Dragon dictation along with smaller phrase technology. Any transcriptional errors that result from this process are unintentional.

## 2021-11-08 ENCOUNTER — Ambulatory Visit (INDEPENDENT_AMBULATORY_CARE_PROVIDER_SITE_OTHER): Payer: 59 | Admitting: Family Medicine

## 2021-11-08 ENCOUNTER — Other Ambulatory Visit: Payer: Self-pay

## 2021-11-08 VITALS — BP 130/96 | HR 61 | Ht 66.0 in | Wt 210.0 lb

## 2021-11-08 DIAGNOSIS — M9902 Segmental and somatic dysfunction of thoracic region: Secondary | ICD-10-CM | POA: Diagnosis not present

## 2021-11-08 DIAGNOSIS — M999 Biomechanical lesion, unspecified: Secondary | ICD-10-CM | POA: Diagnosis not present

## 2021-11-08 DIAGNOSIS — M9903 Segmental and somatic dysfunction of lumbar region: Secondary | ICD-10-CM

## 2021-11-08 DIAGNOSIS — S39012D Strain of muscle, fascia and tendon of lower back, subsequent encounter: Secondary | ICD-10-CM | POA: Diagnosis not present

## 2021-11-08 NOTE — Assessment & Plan Note (Signed)
Patient did have a strain initially.  Responded extremely well to the osteopathic manipulation previously attempted and again today.  Patient has meloxicam.  Do feel the patient can follow-up in 52-month intervals if necessary.  Otherwise patient can follow-up as needed as well.

## 2021-11-08 NOTE — Patient Instructions (Signed)
Good to see you! Tried manipulation again Try being active See you again in 3 months

## 2021-11-22 ENCOUNTER — Encounter: Payer: Self-pay | Admitting: Family Medicine

## 2021-11-22 ENCOUNTER — Other Ambulatory Visit: Payer: Self-pay

## 2021-11-22 ENCOUNTER — Ambulatory Visit (INDEPENDENT_AMBULATORY_CARE_PROVIDER_SITE_OTHER): Payer: 59 | Admitting: Family Medicine

## 2021-11-22 VITALS — BP 132/98 | HR 74 | Ht 66.0 in | Wt 212.0 lb

## 2021-11-22 DIAGNOSIS — M2550 Pain in unspecified joint: Secondary | ICD-10-CM | POA: Diagnosis not present

## 2021-11-22 DIAGNOSIS — M9902 Segmental and somatic dysfunction of thoracic region: Secondary | ICD-10-CM | POA: Diagnosis not present

## 2021-11-22 DIAGNOSIS — M9904 Segmental and somatic dysfunction of sacral region: Secondary | ICD-10-CM

## 2021-11-22 DIAGNOSIS — M9903 Segmental and somatic dysfunction of lumbar region: Secondary | ICD-10-CM | POA: Diagnosis not present

## 2021-11-22 DIAGNOSIS — M9908 Segmental and somatic dysfunction of rib cage: Secondary | ICD-10-CM

## 2021-11-22 DIAGNOSIS — S39012D Strain of muscle, fascia and tendon of lower back, subsequent encounter: Secondary | ICD-10-CM

## 2021-11-22 DIAGNOSIS — M9901 Segmental and somatic dysfunction of cervical region: Secondary | ICD-10-CM

## 2021-11-22 MED ORDER — KETOROLAC TROMETHAMINE 30 MG/ML IJ SOLN
30.0000 mg | Freq: Once | INTRAMUSCULAR | Status: AC
  Administered 2021-11-22: 09:00:00 30 mg via INTRAMUSCULAR

## 2021-11-22 MED ORDER — METHYLPREDNISOLONE ACETATE 40 MG/ML IJ SUSP
40.0000 mg | Freq: Once | INTRAMUSCULAR | Status: AC
  Administered 2021-11-22: 09:00:00 40 mg via INTRAMUSCULAR

## 2021-11-22 NOTE — Progress Notes (Signed)
[place] Phone: [phone] Subjective:   Maxwell Maxwell [name], am serving as a scribe for Dr. Hulan Saas.  This visit occurred during the SARS-CoV-2 public health emergency.  Safety protocols were in place, including screening questions prior to the visit, additional usage of staff PPE, and extensive cleaning of exam room while observing appropriate contact time as indicated for disinfecting solutions.   I'm seeing this patient by the request  of:  Maxwell Alar, NP  CC: Low back pain  [id number]  Maxwell Maxwell [name] is a 40 y.o. male coming in with complaint of back and neck pain. OMT 11/08/2021. Patient states that he was sitting in a low chair and rolled over in bed the next day and felt a pop. Pain is shooting from back down into R leg.  Patient states that it is more of a dull, throbbing aching pain.  Concerned that it may get significantly worse.  Patient has had x-ray showing an L5-S1 degenerative disc disease.  Medications patient has been prescribed: Meloxicam  Taking:         Reviewed prior external information including notes and imaging from previsou exam, outside providers and external EMR if available.   As well as notes that were available from care everywhere and other healthcare systems.  Past medical history, social, surgical and family history all reviewed in electronic medical record.  Maxwell [name] pertanent information unless stated regarding to the chief complaint.   Past Medical History:  Diagnosis Date   Asthma    childhood asthma    Maxwell [name] Known Allergies   Review of Systems:  Maxwell [name] headache, visual changes, nausea, vomiting, diarrhea, constipation, dizziness, abdominal pain, skin rash, fevers, chills, night sweats, weight loss, swollen lymph nodes, body aches, joint swelling, chest pain, shortness of breath, mood changes. POSITIVE muscle aches  Objective  Blood pressure (!) 132/98, pulse 74,  height 5\' 6"  (1.676 m), weight 212 lb (96.2 kg), SpO2 96 %.   General: Maxwell [name] apparent distress alert and oriented x3 mood and affect normal, dressed appropriately.  HEENT: Pupils equal, extraocular movements intact  Respiratory: Patient's speak in full sentences and does not appear short of breath  Cardiovascular: Maxwell [name] lower extremity edema, non tender, Maxwell [name] erythema  Low back exam does have some loss of lordosis.  Patient does have mild positive Spurling's noted on the right side.  Tightness noted with this straight leg test on the right greater than left.   Osteopathic findings  C2 flexed rotated and side bent right C6 flexed rotated and side bent left T3 extended rotated and side bent right inhaled rib T6 extended rotated and side bent left L2 flexed rotated and side bent right Sacrum right on right       Assessment and Plan:  Lumbar strain Chronic, with exacerbation.  Is having more radicular symptoms.  Discussed with patient about the possibility of different medications.  Patient really wanted to try the Toradol and Depo-Medrol.  Warned the potential side effects but I do think patient will do relatively well.  Discussed about core strengthening exercises.  Follow-up again in 4 to 6 weeks.    Nonallopathic problems  Decision today to treat with OMT was based on Physical Exam  After verbal consent patient was treated with HVLA, ME, FPR techniques in cervical, rib, thoracic, lumbar, and sacral  areas  Patient tolerated the procedure well with improvement in symptoms  Patient given exercises, stretches and lifestyle modifications  See medications  in patient instructions if given  Patient will follow up in 4-8 weeks      The above documentation has been reviewed and is accurate and complete Maxwell Pulley, DO       Note: This dictation was prepared with Dragon dictation along with smaller phrase technology. Any transcriptional errors that result from this process are  unintentional.

## 2021-11-22 NOTE — Patient Instructions (Signed)
Good to see you Continue exercises See me in 4 weeks

## 2021-11-22 NOTE — Assessment & Plan Note (Signed)
Chronic, with exacerbation.  Is having more radicular symptoms.  Discussed with patient about the possibility of different medications.  Patient really wanted to try the Toradol and Depo-Medrol.  Warned the potential side effects but I do think patient will do relatively well.  Discussed about core strengthening exercises.  Follow-up again in 4 to 6 weeks.

## 2021-11-27 ENCOUNTER — Other Ambulatory Visit: Payer: Self-pay | Admitting: Sports Medicine

## 2022-03-01 ENCOUNTER — Ambulatory Visit (INDEPENDENT_AMBULATORY_CARE_PROVIDER_SITE_OTHER): Payer: 59 | Admitting: Sports Medicine

## 2022-03-01 VITALS — BP 122/80 | HR 74 | Ht 66.0 in | Wt 217.0 lb

## 2022-03-01 DIAGNOSIS — M25551 Pain in right hip: Secondary | ICD-10-CM

## 2022-03-01 DIAGNOSIS — M9903 Segmental and somatic dysfunction of lumbar region: Secondary | ICD-10-CM

## 2022-03-01 DIAGNOSIS — M24851 Other specific joint derangements of right hip, not elsewhere classified: Secondary | ICD-10-CM | POA: Diagnosis not present

## 2022-03-01 DIAGNOSIS — M9905 Segmental and somatic dysfunction of pelvic region: Secondary | ICD-10-CM | POA: Diagnosis not present

## 2022-03-01 DIAGNOSIS — M9904 Segmental and somatic dysfunction of sacral region: Secondary | ICD-10-CM

## 2022-03-01 MED ORDER — MELOXICAM 7.5 MG PO TABS: 7.5000 mg | ORAL_TABLET | Freq: Every day | ORAL | 0 refills | Status: DC

## 2022-03-01 NOTE — Patient Instructions (Addendum)
Good to see you  ? Start meloxicam 7.5 mg daily x2 weeks.  If still having pain after 2 weeks, complete 3rd-week of meloxicam. May use remaining meloxicam as needed once daily for pain control.  Do not to use additional NSAIDs while taking meloxicam.  May use Tylenol [phone] mg 2 to 3 times a day for breakthrough pain. ?Hip HEP ?2-3 week follow up  ?

## 2022-03-01 NOTE — Progress Notes (Signed)
? ? Maxwell [name] D.Maxwell [name] ?Maxwell [name] ?[name] ?Phone: [phone] ?  ?Assessment and Plan:   ?  ?1. Right hip pain ?2. Snapping hip syndrome, right ?3. Somatic dysfunction of lumbar region ?4. Somatic dysfunction of pelvic region ?5. Somatic dysfunction of sacral region ?-Acute, uncomplicated, initial sports [name] visit ?- New onset of right anterior hip pain after a painless snapping sensation while exercising.  Consistent with snapping hip syndrome from iliopsoas and tight hip flexors ?- Start HEP targeting stretching hip flexors ?- Start meloxicam 15 mg daily x2 weeks.  If still having pain after 2 weeks, complete 3rd-week of meloxicam. May use remaining meloxicam as needed once daily for pain control.  Do not to use additional NSAIDs while taking meloxicam.  May use Tylenol [phone] mg 2 to 3 times a day for breakthrough pain. ?- Patient has received significant relief with OMT in the past.  Elects for repeat OMT today.  Tolerated well per note below. ?- Decision today to treat with OMT was based on Physical Exam ? ?After verbal consent patient was treated with HVLA (high velocity low amplitude), ME (muscle energy), FPR (flex positional release), ST (soft tissue), PC/PD (Pelvic Compression/ Pelvic Decompression) techniques in sacrum, lumbar, and pelvic areas. Patient tolerated the procedure well with improvement in symptoms.  Patient educated on potential side effects of soreness and recommended to rest, hydrate, and use Tylenol as needed for pain control.  ?  ?Pertinent previous records reviewed include none ?  ?Follow Up: 2 to 3 weeks for reevaluation.  Could repeat OMT ?  ?Subjective:   ?I, Maxwell [name], am serving as a Education administrator for [name] ? ?Chief Complaint: right sided pain  ? ?HPI:  ? ?03/01/22 ?Patient is a 41 year old male complaining of right sided pain. Patient states that his hip and knee is inflamed , hip has been in  pain for 2 days thinks it from working out ,increasing weight at the gym heard a pop getting gout of bed the other day no radiating pain, a little tingling no numbness, took meloxicam this morning and that has helped, for the last couple of days the pain comes and goes, ? ?Relevant Historical Information: None pertinent ? ?Additional pertinent review of systems negative. ? ? ?Current Outpatient Medications:  ?  COVID-19 mRNA vaccine, Moderna, 100 MCG/0.5ML injection, Inject into the muscle., Disp: 0.25 mL, Rfl: 0 ?  gabapentin (NEURONTIN) 100 MG capsule, Take 1 capsule (100 mg total) by mouth at bedtime., Disp: 90 capsule, Rfl: 0 ?  oxyCODONE-acetaminophen (PERCOCET) 10-325 MG tablet, Take 1 tablet by mouth every 6 (six) hours as needed for pain., Disp: 20 tablet, Rfl: 0 ?  meloxicam (MOBIC) 7.5 MG tablet, Take 1 tablet (7.5 mg total) by mouth daily., Disp: 30 tablet, Rfl: 0  ? ?Objective:   ?  ?Vitals:  ? 03/01/22 1359  ?BP: 122/80  ?Pulse: 74  ?SpO2: 96%  ?Weight: 217 lb (98.4 kg)  ?Height: '5\' 6"'$  (1.676 m)  ?  ?  ?Body mass index is 35.02 kg/m?.  ?  ?Physical Exam:   ? ?General: awake, alert, and oriented no acute distress, nontoxic ?Skin: no suspicious lesions or rashes ?Neuro:sensation intact distally with no dificits, normal muscle tone, no atrophy, strength 5/5 in all tested lower ext groups ?Psych: normal mood and affect, speech clear ? ?Right hip: ?No deformity, swelling or wasting ?ROM Flexion 90, ext 20, IR 45, ER 45 ?NTTP over the hip  flexors, greater troch, glute musculature, si joint, lumbar spine ?Negative log roll with FROM ?Negative FABER ?Negative FADIR ?Negative Piriformis test ?Negative trendelenberg ?Gait normal ?Bilaterally tight hip flexors with right more than left ?  ? ?OMT Physical Exam: ? ?ASIS Compression Test: Positive Right ?Sacrum: NTTP bilateral sacral base.  Negative sphinx ?Lumbar: TTP paraspinal, L1-3 RRSL ?Pelvis: Right anterior innominate with out flare ? ?Electronically signed  by:  ?Maxwell [name] D.Maxwell [name] ?Maxwell [name] ?2:42 PM 03/01/22 ?

## 2022-03-05 NOTE — Progress Notes (Deleted)
?   Maxwell [name] ?[facility] ?[place] ?Phone: [phone] ?  ?Assessment and Plan:   ?  ?There are no diagnoses linked to this encounter.  ?*** ?- Patient has received significant relief with OMT in the past.  Elects for repeat OMT today.  Tolerated well per note below. ?- Decision today to treat with OMT was based on Physical Exam ?  ?After verbal consent patient was treated with HVLA (high velocity low amplitude), ME (muscle energy), FPR (flex positional release), ST (soft tissue), PC/PD (Pelvic Compression/ Pelvic Decompression) techniques in cervical, rib, thoracic, lumbar, and pelvic areas. Patient tolerated the procedure well with improvement in symptoms.  Patient educated on potential side effects of soreness and recommended to rest, hydrate, and use Tylenol as needed for pain control. ?  ?Pertinent previous records reviewed include *** ?  ?Follow Up: ***  ? ?  ?Subjective:   ?I, Maxwell [name], am serving as a Education administrator for Maxwell [name] ?  ?Chief Complaint: right sided pain  ?  ?HPI:  ?  ?03/01/22 ?Patient is a 41 year old male complaining of right sided pain. Patient states that his hip and knee is inflamed , hip has been in pain for 2 days thinks it from working out ,increasing weight at the gym heard a pop getting gout of bed the other day no radiating pain, a little tingling no numbness, took meloxicam this morning and that has helped, for the last couple of days the pain comes and goes, ? ?03/06/2022 ?Patient states ?  ?Relevant Historical Information: None pertinent ? ?Additional pertinent review of systems negative. ? ?Current Outpatient Medications  ?Medication Sig Dispense Refill  ? COVID-19 mRNA vaccine, Moderna, 100 MCG/0.5ML injection Inject into the muscle. 0.25 mL 0  ? gabapentin (NEURONTIN) 100 MG capsule Take 1 capsule (100 mg total) by mouth at bedtime. 90 capsule 0  ? meloxicam (MOBIC) 7.5 MG tablet Take 1 tablet (7.5 mg  total) by mouth daily. 30 tablet 0  ? oxyCODONE-acetaminophen (PERCOCET) 10-325 MG tablet Take 1 tablet by mouth every 6 (six) hours as needed for pain. 20 tablet 0  ? ?No current facility-administered medications for this visit.  ?  ?  ?Objective:   ?  ?There were no vitals filed for this visit.  ?  ?There is no height or weight on file to calculate BMI.  ?  ?Physical Exam:   ?  ?General: Well-appearing, cooperative, sitting comfortably in no acute distress.  ? ?OMT Physical Exam: ? ?ASIS Compression Test: Positive Right ?Cervical: TTP paraspinal, *** ?Rib: Bilateral elevated first rib with TTP ?Thoracic: TTP paraspinal,*** ?Lumbar: TTP paraspinal,*** ?Pelvis: Right anterior innominate ? ?Electronically signed by:  ?Maxwell [name] ?[facility] ?12:25 PM 03/05/22 ?

## 2022-03-06 ENCOUNTER — Ambulatory Visit: Payer: 59 | Admitting: Sports Medicine

## 2022-03-28 ENCOUNTER — Other Ambulatory Visit: Payer: Self-pay | Admitting: Sports Medicine

## 2022-04-11 NOTE — Progress Notes (Signed)
Maxwell [name] [place] [address] [place] Phone: [phone] Subjective:   [name], am serving as a scribe for Dr. Hulan Saas. This visit occurred during the SARS-CoV-2 public health emergency.  Safety protocols were in place, including screening questions prior to the visit, additional usage of staff PPE, and extensive cleaning of exam room while observing appropriate contact time as indicated for disinfecting solutions.   I'm seeing this patient by the request  of:  [name], NP  CC: Low back and leg pain follow-up  [id number]  Maxwell [name] is a 41 y.o. male coming in with complaint of back and neck pain. OMT on 03/01/2022 with Dr. Glennon Mac. Patient states same per usual. No new complaints.  Patient states that he has been working out on a more regular basis.  He finds it difficult for her to continue to work at this level because of some discomfort.  Patient describes the pain as a dull, throbbing aching pain from time to time.  Patient states that he has had a horrible pop on the right side of his back did seem to have some improvement with it.  Medications patient has been prescribed: Meloxicam  Taking:         Reviewed prior external information including notes and imaging from previsou exam, outside providers and external EMR if available.   As well as notes that were available from care everywhere and other healthcare systems.  Past medical history, social, surgical and family history all reviewed in electronic medical record.  No pertanent information unless stated regarding to the chief complaint.   Past Medical History:  Diagnosis Date   Asthma    childhood asthma    No Known Allergies   Review of Systems:  No headache, visual changes, nausea, vomiting, diarrhea, constipation, dizziness, abdominal pain, skin rash, fevers, chills, night sweats, weight loss, swollen lymph nodes, body aches, joint swelling,  chest pain, shortness of breath, mood changes. POSITIVE muscle aches  Objective  Blood pressure 128/88, pulse 71, height '5\' 6"'$  (1.676 m), weight 215 lb (97.5 kg), SpO2 98 %.   General: No apparent distress alert and oriented x3 mood and affect normal, dressed appropriately.  HEENT: Pupils equal, extraocular movements intact  Respiratory: Patient's speak in full sentences and does not appear short of breath  Cardiovascular: No lower extremity edema, non tender, no erythema  Does have some mild loss of lordosis.  This is of the lumbar spine.  Patient also has what appears to be tenderness over the sacroiliac joint.  Mild anterior rotation of the hip noted.  5 out of 5 strength of the lower extremity  Osteopathic findings   T9 extended rotated and side bent left L2 flexed rotated and side bent right Sacrum right on right       Assessment and Plan: Lumbar strain Patient previously has had more of a lumbar strain.  Still has some intermittent radicular symptoms into the quad aspect as well.  Responding well to osteopathic manipulation.  We discussed posture and ergonomics, discussed which activities to do and which ones to avoid.  Increase activity slowly.  Follow-up again 6 to 8 weeks.    Nonallopathic problems  Decision today to treat with OMT was based on Physical Exam  After verbal consent patient was treated with HVLA, ME, FPR techniques in  thoracic, lumbar, and sacral  areas  Patient tolerated the procedure well with improvement in symptoms  Patient given exercises, stretches and lifestyle  modifications  See medications in patient instructions if given  Patient will follow up in 4-8 weeks      The above documentation has been reviewed and is accurate and complete Lyndal Pulley, DO        Note: This dictation was prepared with Dragon dictation along with smaller phrase technology. Any transcriptional errors that result from this process are unintentional.

## 2022-04-12 ENCOUNTER — Ambulatory Visit (INDEPENDENT_AMBULATORY_CARE_PROVIDER_SITE_OTHER): Payer: 59 | Admitting: Family Medicine

## 2022-04-12 VITALS — BP 128/88 | HR 71 | Ht 66.0 in | Wt 215.0 lb

## 2022-04-12 DIAGNOSIS — M9902 Segmental and somatic dysfunction of thoracic region: Secondary | ICD-10-CM | POA: Diagnosis not present

## 2022-04-12 DIAGNOSIS — M9903 Segmental and somatic dysfunction of lumbar region: Secondary | ICD-10-CM

## 2022-04-12 DIAGNOSIS — M9904 Segmental and somatic dysfunction of sacral region: Secondary | ICD-10-CM

## 2022-04-12 DIAGNOSIS — S39012D Strain of muscle, fascia and tendon of lower back, subsequent encounter: Secondary | ICD-10-CM

## 2022-04-12 MED ORDER — MELOXICAM 7.5 MG PO TABS: 7.5000 mg | ORAL_TABLET | Freq: Every day | ORAL | 0 refills | Status: DC

## 2022-04-12 NOTE — Assessment & Plan Note (Signed)
Patient previously has had more of a lumbar strain.  Still has some intermittent radicular symptoms into the quad aspect as well.  Responding well to osteopathic manipulation.  We discussed posture and ergonomics, discussed which activities to do and which ones to avoid.  Increase activity slowly.  Follow-up again 6 to 8 weeks.

## 2022-04-12 NOTE — Patient Instructions (Signed)
Good to see you! Remember how to pop your SI when needed Keep working on core and use bands for hip abductors See you again in 8 weeks

## 2022-04-24 NOTE — Progress Notes (Unsigned)
Maxwell [name] Phone: [phone] Subjective:   Maxwell [name], am serving as a scribe for Dr. Hulan Saas.  I'm seeing this patient by the request  of:  [name], NP  CC: back and neck pain   Maxwell [name]  Maxwell [name] is a 41 y.o. male coming in with complaint of back and neck pain. OMT on 5/18/203. Patient states that on Monday he bent down to the floor and felt pain in L lumbar spine. Also notes nerve pain in R quad has increased. Symptoms overall improving.   Medications patient has been prescribed: Mobic  Taking:       Reviewed prior external information including notes and imaging from previsou exam, outside providers and external EMR if available.   As well as notes that were available from care everywhere and other healthcare systems.  Past medical history, social, surgical and family history all reviewed in electronic medical record.  [name] pertanent information unless stated regarding to the chief complaint.   Past Medical History:  Diagnosis Date   Asthma    childhood asthma    [name] Known Allergies   Review of Systems:  [name] headache, visual changes, nausea, vomiting, diarrhea, constipation, dizziness, abdominal pain, skin rash, fevers, chills, night sweats, weight loss, swollen lymph nodes, body aches, joint swelling, chest pain, shortness of breath, mood changes. POSITIVE muscle aches  Objective  Pulse 64, height '5\' 6"'$  (1.676 m), weight 211 lb (95.7 kg), SpO2 97 %.   General: [name] apparent distress alert and oriented x3 mood and affect normal, dressed appropriately.  HEENT: Pupils equal, extraocular movements intact  Respiratory: Patient's speak in full sentences and does not appear short of breath  Cardiovascular: [name] lower extremity edema, non tender, [name] erythema  Back exam does have some loss of lordosis.  Some tenderness to palpation in the paraspinal musculature.  Patient does still  have some tightness noted in the sacroiliac joint.  Osteopathic findings  C5 flexed rotated and side bent left T3 extended rotated and side bent right inhaled rib T5 extended rotated and side bent left L2 flexed rotated and side bent right L5 flexed rotated and side bent left Sacrum right on right       Assessment and Plan:  Lumbar strain Patient does have more of a strain on the lumbar I still think.  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  Discussed icing regimen and home exercises.  Which activities to do and which ones to avoid.  Increase activity slowly.  We will follow-up again in 6 to 8 weeks.    Nonallopathic problems  Decision today to treat with OMT was based on Physical Exam  After verbal consent patient was treated with HVLA, ME, FPR techniques in cervical, rib, thoracic, lumbar, and sacral  areas  Patient tolerated the procedure well with improvement in symptoms  Patient given exercises, stretches and lifestyle modifications  See medications in patient instructions if given  Patient will follow up in 4-8 weeks      The above documentation has been reviewed and is accurate and complete Maxwell Pulley, DO        Note: This dictation was prepared with Dragon dictation along with smaller phrase technology. Any transcriptional errors that result from this process are unintentional.

## 2022-04-25 ENCOUNTER — Ambulatory Visit (INDEPENDENT_AMBULATORY_CARE_PROVIDER_SITE_OTHER): Payer: 59 | Admitting: Family Medicine

## 2022-04-25 VITALS — HR 64 | Ht 66.0 in | Wt 211.0 lb

## 2022-04-25 DIAGNOSIS — M9903 Segmental and somatic dysfunction of lumbar region: Secondary | ICD-10-CM | POA: Diagnosis not present

## 2022-04-25 DIAGNOSIS — S39012D Strain of muscle, fascia and tendon of lower back, subsequent encounter: Secondary | ICD-10-CM | POA: Diagnosis not present

## 2022-04-25 DIAGNOSIS — M9908 Segmental and somatic dysfunction of rib cage: Secondary | ICD-10-CM

## 2022-04-25 DIAGNOSIS — M9901 Segmental and somatic dysfunction of cervical region: Secondary | ICD-10-CM | POA: Diagnosis not present

## 2022-04-25 DIAGNOSIS — M9902 Segmental and somatic dysfunction of thoracic region: Secondary | ICD-10-CM | POA: Diagnosis not present

## 2022-04-25 DIAGNOSIS — M9904 Segmental and somatic dysfunction of sacral region: Secondary | ICD-10-CM | POA: Diagnosis not present

## 2022-04-25 MED ORDER — PREDNISONE 50 MG PO TABS: ORAL_TABLET | ORAL | 0 refills | Status: DC

## 2022-04-25 NOTE — Assessment & Plan Note (Signed)
Patient does have more of a strain on the lumbar I still think.  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  Discussed icing regimen and home exercises.  Which activities to do and which ones to avoid.  Increase activity slowly.  We will follow-up again in 6 to 8 weeks.

## 2022-04-25 NOTE — Patient Instructions (Addendum)
Meloxicam for 5 days Prednisone '50mg'$  for 5 days when needed Stretches and core strength are key

## 2022-04-26 ENCOUNTER — Telehealth: Payer: Self-pay | Admitting: Family Medicine

## 2022-04-26 NOTE — Telephone Encounter (Signed)
Pt seen yesterday for OMT. Called this morning to be seen again for another adjustment. Back felt good when he left but is more stiff today. Has a long drive this weekend and is concerned.  I made an appt for 6/2 but wonder if this is necessary.

## 2022-04-26 NOTE — Telephone Encounter (Signed)
Ice and heat does not seem to be helping. Is there anything else he can try? He is getting ready to leave for a long trip on Saturday and is worried about the pain he is having while he is gone.

## 2022-04-26 NOTE — Telephone Encounter (Signed)
Called pt, advised we will cancel appt for 6/2 and informed to give his back time after yesterday's appt. Soreness/stiffness to be expected. OK to alternate ice/heat and rest for the next couple of days. Pt understood.

## 2022-04-27 ENCOUNTER — Encounter: Payer: 59 | Admitting: Family

## 2022-04-27 ENCOUNTER — Ambulatory Visit: Payer: 59 | Admitting: Family Medicine

## 2022-04-27 NOTE — Telephone Encounter (Signed)
Patient called stating that his is feeling some better but he is having numbness in his index finger on his right hand. He was concerned that something could be pinching a nerve?   Please advise.

## 2022-04-27 NOTE — Telephone Encounter (Signed)
Spoke with patient. Recommended he start taking the prednisone and give it some time. Also recommended that if numbness spreads or intensifies to seek medical attention.

## 2022-05-04 ENCOUNTER — Encounter: Payer: 59 | Admitting: Family

## 2022-05-11 ENCOUNTER — Telehealth: Payer: Self-pay | Admitting: Family Medicine

## 2022-05-11 ENCOUNTER — Other Ambulatory Visit: Payer: Self-pay

## 2022-05-11 MED ORDER — PREDNISONE 50 MG PO TABS: ORAL_TABLET | ORAL | 0 refills | Status: DC

## 2022-05-11 NOTE — Telephone Encounter (Signed)
Pt called to request refill of Predisone, still having leg inflammation.  [facility]

## 2022-05-11 NOTE — Telephone Encounter (Signed)
Patient called stating that he feels like he is having a flare up after working out yesterday.  He said that he feels like he has more inflammation and asked if Dr Tamala Julian would refill his predniSONE (DELTASONE) 50 MG tablet.  Please advise.

## 2022-05-11 NOTE — Telephone Encounter (Signed)
Called in and patient notified

## 2022-05-15 ENCOUNTER — Encounter: Payer: 59 | Admitting: Family

## 2022-05-15 NOTE — Telephone Encounter (Signed)
RX filled 05/11/2022

## 2022-05-25 ENCOUNTER — Ambulatory Visit (INDEPENDENT_AMBULATORY_CARE_PROVIDER_SITE_OTHER): Payer: 59 | Admitting: Family

## 2022-05-25 ENCOUNTER — Encounter: Payer: Self-pay | Admitting: Family

## 2022-05-25 VITALS — BP 142/84 | HR 58 | Temp 98.0°F | Resp 16 | Ht 66.0 in | Wt 210.0 lb

## 2022-05-25 DIAGNOSIS — R030 Elevated blood-pressure reading, without diagnosis of hypertension: Secondary | ICD-10-CM | POA: Diagnosis not present

## 2022-05-25 DIAGNOSIS — Z125 Encounter for screening for malignant neoplasm of prostate: Secondary | ICD-10-CM | POA: Diagnosis not present

## 2022-05-25 DIAGNOSIS — Z0000 Encounter for general adult medical examination without abnormal findings: Secondary | ICD-10-CM

## 2022-05-25 LAB — BASIC METABOLIC PANEL
BUN: 12 mg/dL (ref 6–23)
CO2: 31 mEq/L (ref 19–32)
Calcium: 9.3 mg/dL (ref 8.4–10.5)
Chloride: 102 mEq/L (ref 96–112)
Creatinine, Ser: 1.17 mg/dL (ref 0.40–1.50)
GFR: 77.96 mL/min (ref >60.00)
Glucose, Bld: 87 mg/dL (ref 70–99)
Potassium: 4.4 mEq/L (ref 3.5–5.1)
Sodium: 138 mEq/L (ref 135–145)

## 2022-05-25 LAB — PSA: PSA: 0.42 ng/mL (ref 0.10–4.00)

## 2022-05-25 NOTE — Assessment & Plan Note (Addendum)
Wt Readings from Last 3 Encounters:  05/25/22 210 lb (95.3 kg)  04/25/22 211 lb (95.7 kg)  04/12/22 215 lb (97.5 kg)   Recommended that he continue his work on healthy diet, exercise and weight loss. Recommended that he get the Bivalent Covid Booster at the pharmacy.

## 2022-05-25 NOTE — Progress Notes (Signed)
Subjective:   By signing my name below, I, Carylon Perches, attest that this documentation has been prepared under the direction and in the presence of Karie Chimera, NP 05/25/2022     Patient ID: Maxwell [name], male    DOB: 14-Apr-1981, 41 y.o.   MRN: [id number]  Chief Complaint  Patient presents with   Annual Exam    HPI Patient is in today for a comprehensive physical exam.   Blood pressure: His blood pressure is elevated in clinic today at 142/84. He is currently not taking any blood pressure medications.  BP Readings from Last 3 Encounters:  05/25/22 (!) 142/84  04/12/22 128/88  03/01/22 122/80   Pulse Readings from Last 3 Encounters:  05/25/22 (!) 58  04/25/22 64  04/12/22 71   He denies having any fever, new muscle pain, joint pain , new moles, congestion, sinus pain, sore throat, palpations, cough, SOB ,wheezing,n/v/d constipation, blood in stool, dysuria, frequency, hematuria, depression, anxiety, headaches at this time  Social history: He denies a family history of hypertension in his parents. No recent changes to his family medical history. He denies any recent surgeries. Immunizations: Last Tetanus in 2020. Received 3 Moderna COVID vaccinations Exercise: He states he is doing better. Previously he was limited by his sciatic back pain. This has improved and he is restarting his exercise routines. He continues to follow up with orthopedics and receives periodic adjustments.   Health Maintenance Due  Topic Date Due   COVID-19 Vaccine (3 - Moderna series) 08/22/2021    Past Medical History:  Diagnosis Date   Asthma    childhood asthma    History reviewed. No pertinent surgical history.  Family History  Problem Relation Age of Onset   Diabetes Half-Sister    Cancer Maternal Uncle        prostate   Liver disease Sister    Heart disease Neg Hx    Hypertension Neg Hx    Kidney disease Neg Hx     Social History   Socioeconomic History   Marital  status: Single    Spouse name: Not on file   Number of children: Not on file   Years of education: Not on file   Highest education level: Not on file  Occupational History   Not on file  Tobacco Use   Smoking status: Never   Smokeless tobacco: Never  Substance and Sexual Activity   Alcohol use: No   Drug use: Never   Sexual activity: Yes    Partners: Female  Other Topics Concern   Not on file  Social History Narrative   Married   No children   Actuary   Bachelors degree   Enjoys basketball   Grew up in [place].   Social Determinants of Health   Financial Resource Strain: Not on file  Food Insecurity: Not on file  Transportation Needs: Not on file  Physical Activity: Not on file  Stress: Not on file  Social Connections: Not on file  Intimate Partner Violence: Not on file    Outpatient Medications Prior to Visit  Medication Sig Dispense Refill   meloxicam (MOBIC) 7.5 MG tablet Take 1 tablet (7.5 mg total) by mouth daily. 90 tablet 0   COVID-19 mRNA vaccine, Moderna, 100 MCG/0.5ML injection Inject into the muscle. 0.25 mL 0   gabapentin (NEURONTIN) 100 MG capsule Take 1 capsule (100 mg total) by mouth at bedtime. 90 capsule 0   oxyCODONE-acetaminophen (PERCOCET) 10-325 MG tablet Take 1  tablet by mouth every 6 (six) hours as needed for pain. 20 tablet 0   predniSONE (DELTASONE) 50 MG tablet Take one tablet daily for the next 5 days. 5 tablet 0   No facility-administered medications prior to visit.    No Known Allergies  Review of Systems  Constitutional:  Negative for fever.  HENT:  Negative for congestion, sinus pain and sore throat.   Respiratory:  Negative for wheezing.   Cardiovascular:  Negative for palpitations.  Gastrointestinal:  Negative for blood in stool, constipation, diarrhea, nausea and vomiting.  Genitourinary:  Negative for dysuria, frequency and hematuria.  Musculoskeletal:  Negative for joint pain and myalgias.  Skin:        (-) New  Moles  Neurological:  Negative for headaches.  Psychiatric/Behavioral:  Negative for depression. The patient is not nervous/anxious.        Objective:    Physical Exam Constitutional:      General: He is not in acute distress.    Appearance: Normal appearance. He is not ill-appearing.  HENT:     Head: Normocephalic and atraumatic.     Right Ear: Tympanic membrane, ear canal and external ear normal. There is impacted cerumen (Minimal).     Left Ear: Tympanic membrane, ear canal and external ear normal.  Eyes:     Extraocular Movements: Extraocular movements intact.     Pupils: Pupils are equal, round, and reactive to light.  Neck:     Thyroid: No thyromegaly.  Cardiovascular:     Rate and Rhythm: Normal rate and regular rhythm.     Heart sounds: Normal heart sounds. No murmur heard.    No gallop.  Pulmonary:     Effort: Pulmonary effort is normal. No respiratory distress.     Breath sounds: Normal breath sounds. No wheezing or rales.  Musculoskeletal:     Comments: 5/5 strength upper and lower extremeties  Lymphadenopathy:     Cervical: No cervical adenopathy.  Skin:    General: Skin is warm and dry.  Neurological:     Mental Status: He is alert and oriented to person, place, and time.     Deep Tendon Reflexes:     Reflex Scores:      Patellar reflexes are 2+ on the right side and 2+ on the left side. Psychiatric:        Mood and Affect: Mood normal.        Behavior: Behavior normal.        Judgment: Judgment normal.     BP (!) 142/84 (BP Location: Right Arm, Patient Position: Sitting, Cuff Size: Large)   Pulse (!) 58   Temp 98 F (36.7 C) (Oral)   Resp 16   Ht '5\' 6"'$  (1.676 m)   Wt 210 lb (95.3 kg)   SpO2 100%   BMI 33.89 kg/m  Wt Readings from Last 3 Encounters:  05/25/22 210 lb (95.3 kg)  04/25/22 211 lb (95.7 kg)  04/12/22 215 lb (97.5 kg)       Assessment & Plan:   Problem List Items Addressed This Visit       Unprioritized   Routine general  medical examination at a health care facility    Wt Readings from Last 3 Encounters:  05/25/22 210 lb (95.3 kg)  04/25/22 211 lb (95.7 kg)  04/12/22 215 lb (97.5 kg)  Recommended that he continue his work on healthy diet, exercise and weight loss. Recommended that he get the Bivalent Covid Booster at the pharmacy.  Elevated blood pressure reading    Initial BP was quite elevated, follow up bp was mildly elevated. Will plan to bring him back in 1 month for a blood pressure recheck.       Relevant Orders   Basic metabolic panel   Other Visit Diagnoses     Prostate cancer screening    -  Primary   Relevant Orders   PSA        No orders of the defined types were placed in this encounter.   I, Nance Pear, NP, personally preformed the services described in this documentation.  All medical record entries made by the scribe were at my direction and in my presence.  I have reviewed the chart and discharge instructions (if applicable) and agree that the record reflects my personal performance and is accurate and complete. 05/25/2022   I,Amber Collins,acting as a scribe for Nance Pear, NP.,have documented all relevant documentation on the behalf of Nance Pear, NP,as directed by  Nance Pear, NP while in the presence of Nance Pear, NP.   Nance Pear, NP

## 2022-05-25 NOTE — Assessment & Plan Note (Addendum)
New. Initial BP was quite elevated, follow up bp was mildly elevated. Will plan to bring him back in 1 month for a blood pressure recheck.

## 2022-05-25 NOTE — Progress Notes (Deleted)
  [place] Phone: [phone] Subjective:    I'm seeing this patient by the request  of:  [name], NP  CC:   [id number]  Maxwell [name] is a 41 y.o. male coming in with complaint of back and neck pain. OMT on 04/25/2022. Patient states   Medications patient has been prescribed: Prednisone Mobic  Taking:         Reviewed prior external information including notes and imaging from previsou exam, outside providers and external EMR if available.   As well as notes that were available from care everywhere and other healthcare systems.  Past medical history, social, surgical and family history all reviewed in electronic medical record.  No pertanent information unless stated regarding to the chief complaint.   Past Medical History:  Diagnosis Date   Asthma    childhood asthma    No Known Allergies   Review of Systems:  No headache, visual changes, nausea, vomiting, diarrhea, constipation, dizziness, abdominal pain, skin rash, fevers, chills, night sweats, weight loss, swollen lymph nodes, body aches, joint swelling, chest pain, shortness of breath, mood changes. POSITIVE muscle aches  Objective  There were no vitals taken for this visit.   General: No apparent distress alert and oriented x3 mood and affect normal, dressed appropriately.  HEENT: Pupils equal, extraocular movements intact  Respiratory: Patient's speak in full sentences and does not appear short of breath  Cardiovascular: No lower extremity edema, non tender, no erythema  Gait MSK:  Back   Osteopathic findings  C2 flexed rotated and side bent right C6 flexed rotated and side bent left T3 extended rotated and side bent right inhaled rib T9 extended rotated and side bent left L2 flexed rotated and side bent right Sacrum right on right       Assessment and Plan:  No problem-specific Assessment & Plan notes found for  this encounter.    Nonallopathic problems  Decision today to treat with OMT was based on Physical Exam  After verbal consent patient was treated with HVLA, ME, FPR techniques in cervical, rib, thoracic, lumbar, and sacral  areas  Patient tolerated the procedure well with improvement in symptoms  Patient given exercises, stretches and lifestyle modifications  See medications in patient instructions if given  Patient will follow up in 4-8 weeks             Note: This dictation was prepared with Dragon dictation along with smaller phrase technology. Any transcriptional errors that result from this process are unintentional.

## 2022-06-04 ENCOUNTER — Telehealth: Payer: Self-pay | Admitting: Family Medicine

## 2022-06-04 NOTE — Telephone Encounter (Signed)
Patient called asking if prednisone could be refilled for him? (He also rescheduled his appointment that was scheduled for tomorrow, July 10th to August 9th)  Please advise.

## 2022-06-05 ENCOUNTER — Ambulatory Visit: Payer: 59 | Admitting: Family Medicine

## 2022-06-05 ENCOUNTER — Other Ambulatory Visit: Payer: Self-pay

## 2022-06-05 MED ORDER — PREDNISONE 50 MG PO TABS
ORAL_TABLET | ORAL | 0 refills | Status: DC
Start: 2022-06-05 — End: 2022-07-19

## 2022-06-05 NOTE — Telephone Encounter (Signed)
Rx refilled. Patient notified.

## 2022-06-26 ENCOUNTER — Ambulatory Visit: Payer: 59 | Admitting: Family

## 2022-06-26 ENCOUNTER — Telehealth: Payer: Self-pay

## 2022-06-26 NOTE — Progress Notes (Shared)
Subjective:   By signing my name below, I, Carylon Perches, attest that this documentation has been prepared under the direction and in the presence of Karie Chimera, NP 06/26/2022   Patient ID: Maxwell [name], male    DOB: Feb 06, 1981, 41 y.o.   MRN: [id number]  No chief complaint on file.   HPI Patient is in today for an office visit  Health Maintenance Due  Topic Date Due   COVID-19 Vaccine (3 - Moderna series) 08/22/2021   INFLUENZA VACCINE  06/26/2022    Past Medical History:  Diagnosis Date   Asthma    childhood asthma    No past surgical history on file.  Family History  Problem Relation Age of Onset   Diabetes Half-Sister    Cancer Maternal Uncle        prostate   Liver disease Sister    Heart disease Neg Hx    Hypertension Neg Hx    Kidney disease Neg Hx     Social History   Socioeconomic History   Marital status: Single    Spouse name: Not on file   Number of children: Not on file   Years of education: Not on file   Highest education level: Not on file  Occupational History   Not on file  Tobacco Use   Smoking status: Never   Smokeless tobacco: Never  Substance and Sexual Activity   Alcohol use: No   Drug use: Never   Sexual activity: Yes    Partners: Female  Other Topics Concern   Not on file  Social History Narrative   Married   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   Social Determinants of Health   Financial Resource Strain: Not on file  Food Insecurity: Not on file  Transportation Needs: Not on file  Physical Activity: Not on file  Stress: Not on file  Social Connections: Not on file  Intimate Partner Violence: Not on file    Outpatient Medications Prior to Visit  Medication Sig Dispense Refill   meloxicam (MOBIC) 7.5 MG tablet Take 1 tablet (7.5 mg total) by mouth daily. 90 tablet 0   predniSONE (DELTASONE) 50 MG tablet Take one tablet daily for the next 5 days. 5  tablet 0   No facility-administered medications prior to visit.    No Known Allergies  ROS     Objective:    Physical Exam Constitutional:      General: He is not in acute distress.    Appearance: Normal appearance. He is not ill-appearing.  HENT:     Head: Normocephalic and atraumatic.     Right Ear: External ear normal.     Left Ear: External ear normal.  Eyes:     Extraocular Movements: Extraocular movements intact.     Pupils: Pupils are equal, round, and reactive to light.  Cardiovascular:     Rate and Rhythm: Normal rate and regular rhythm.     Heart sounds: Normal heart sounds. No murmur heard.    No gallop.  Pulmonary:     Effort: Pulmonary effort is normal. No respiratory distress.     Breath sounds: Normal breath sounds. No wheezing or rales.  Skin:    General: Skin is warm and dry.  Neurological:     Mental Status: He is alert and oriented to person, place, and time.  Psychiatric:        Mood  and Affect: Mood normal.        Behavior: Behavior normal.        Judgment: Judgment normal.     There were no vitals taken for this visit. Wt Readings from Last 3 Encounters:  05/25/22 210 lb (95.3 kg)  04/25/22 211 lb (95.7 kg)  04/12/22 215 lb (97.5 kg)       Assessment & Plan:   Problem List Items Addressed This Visit   None    No orders of the defined types were placed in this encounter.   I, Carylon Perches, personally preformed the services described in this documentation.  All medical record entries made by the scribe were at my direction and in my presence.  I have reviewed the chart and discharge instructions (if applicable) and agree that the record reflects my personal performance and is accurate and complete. 06/26/2022   I,Amber Collins,acting as a scribe for Nance Pear, NP.,have documented all relevant documentation on the behalf of Nance Pear, NP,as directed by  Nance Pear, NP while in the presence of Nance Pear, NP.    [employer]

## 2022-06-26 NOTE — Telephone Encounter (Signed)
Appointment for today was cancelled

## 2022-06-26 NOTE — Telephone Encounter (Signed)
Caller Name [place] Phone Number [phone] Patient Name Maxwell [name] Patient DOB 1981-04-29 Call Type Message Only Information Provided Reason for Call Request to Reschedule Office Appointment Initial Comment Caller states he would like to reschedule his appt. Patient request to speak to RN No Additional Comment Office hours provided. Disp. Time Disposition Final User 06/25/2022 5:28:48 PM General Information Provided Yes Benetta Spar Call Closed By: Benetta Spar Transaction Date/Time: 06/25/2022 5:27:22 PM (ET)

## 2022-07-03 NOTE — Progress Notes (Deleted)
  [place] Phone: [phone] Subjective:    I'm seeing this patient by the request  of:  [name], NP  CC:   [id number]  Maxwell [name] is a 41 y.o. male coming in with complaint of back and neck pain. OMT on 04/25/2022. Patient states   Medications patient has been prescribed: Mobic, prednisone  Taking:         Reviewed prior external information including notes and imaging from previsou exam, outside providers and external EMR if available.   As well as notes that were available from care everywhere and other healthcare systems.  Past medical history, social, surgical and family history all reviewed in electronic medical record.  No pertanent information unless stated regarding to the chief complaint.   Past Medical History:  Diagnosis Date   Asthma    childhood asthma    No Known Allergies   Review of Systems:  No headache, visual changes, nausea, vomiting, diarrhea, constipation, dizziness, abdominal pain, skin rash, fevers, chills, night sweats, weight loss, swollen lymph nodes, body aches, joint swelling, chest pain, shortness of breath, mood changes. POSITIVE muscle aches  Objective  There were no vitals taken for this visit.   General: No apparent distress alert and oriented x3 mood and affect normal, dressed appropriately.  HEENT: Pupils equal, extraocular movements intact  Respiratory: Patient's speak in full sentences and does not appear short of breath  Cardiovascular: No lower extremity edema, non tender, no erythema  Gait MSK:  Back   Osteopathic findings  C2 flexed rotated and side bent right C6 flexed rotated and side bent left T3 extended rotated and side bent right inhaled rib T9 extended rotated and side bent left L2 flexed rotated and side bent right Sacrum right on right       Assessment and Plan:  No problem-specific Assessment & Plan notes found for  this encounter.    Nonallopathic problems  Decision today to treat with OMT was based on Physical Exam  After verbal consent patient was treated with HVLA, ME, FPR techniques in cervical, rib, thoracic, lumbar, and sacral  areas  Patient tolerated the procedure well with improvement in symptoms  Patient given exercises, stretches and lifestyle modifications  See medications in patient instructions if given  Patient will follow up in 4-8 weeks             Note: This dictation was prepared with Dragon dictation along with smaller phrase technology. Any transcriptional errors that result from this process are unintentional.

## 2022-07-04 ENCOUNTER — Ambulatory Visit: Payer: 59 | Admitting: Family Medicine

## 2022-07-05 ENCOUNTER — Ambulatory Visit: Payer: 59 | Admitting: Family

## 2022-07-18 NOTE — Progress Notes (Unsigned)
  [place] [address] [place] Phone: [phone] Subjective:   [name], am serving as a scribe for Dr. Hulan Saas.  I'm seeing this patient by the request  of:  [name], NP  CC: Continued back and leg pain  [id number]  Jaidon Rosendahl is a 41 y.o. male coming in with complaint of back and neck pain. OMT on 04/25/2022. Patient states doing well. No pain today. Just here for f/u.  Medications patient has been prescribed: Mobic prednisone  Taking:  Previous imaging included x-rays of the hip that were unremarkable but did have some degenerative disc disease at L5-S1 of the lumbar spine.       Reviewed prior external information including notes and imaging from previsou exam, outside providers and external EMR if available.   As well as notes that were available from care everywhere and other healthcare systems.  Past medical history, social, surgical and family history all reviewed in electronic medical record.  No pertanent information unless stated regarding to the chief complaint.   Past Medical History:  Diagnosis Date   Asthma    childhood asthma    No Known Allergies   Review of Systems:  No headache, visual changes, nausea, vomiting, diarrhea, constipation, dizziness, abdominal pain, skin rash, fevers, chills, night sweats, weight loss, swollen lymph nodes, body aches, joint swelling, chest pain, shortness of breath, mood changes. POSITIVE muscle aches  Objective  Blood pressure 132/84, pulse 86, height '5\' 6"'$  (1.676 m), weight 214 lb (97.1 kg), SpO2 98 %.   General: No apparent distress alert and oriented x3 mood and affect normal, dressed appropriately.  HEENT: Pupils equal, extraocular movements intact  Respiratory: Patient's speak in full sentences and does not appear short of breath  Cardiovascular: No lower extremity edema, non tender, no erythema  Gait normal  MSK:  Back low back does  have loss of lordosis  Tightness of FABER noted as well  Negative straight  Osteopathic findings   T8 extended rotated and side bent left L3 flexed rotated and side bent right Sacrum right on right     Assessment and Plan:  Lumbar strain Doing well.  Discussed icing regimen and home exercises.  Which activities to do which ones to avoid discussed which activities  RTC in 6-8 weeks     Nonallopathic problems  Decision today to treat with OMT was based on Physical Exam  After verbal consent patient was treated with HVLA, ME, FPR techniques in  thoracic, lumbar, and sacral  areas  Patient tolerated the procedure well with improvement in symptoms  Patient given exercises, stretches and lifestyle modifications  See medications in patient instructions if given  Patient will follow up in 4-8 weeks    The above documentation has been reviewed and is accurate and complete Lyndal Pulley, DO          Note: This dictation was prepared with Dragon dictation along with smaller phrase technology. Any transcriptional errors that result from this process are unintentional.

## 2022-07-19 ENCOUNTER — Ambulatory Visit (INDEPENDENT_AMBULATORY_CARE_PROVIDER_SITE_OTHER): Payer: 59 | Admitting: Family Medicine

## 2022-07-19 VITALS — BP 132/84 | HR 86 | Ht 66.0 in | Wt 214.0 lb

## 2022-07-19 DIAGNOSIS — M9904 Segmental and somatic dysfunction of sacral region: Secondary | ICD-10-CM

## 2022-07-19 DIAGNOSIS — M9902 Segmental and somatic dysfunction of thoracic region: Secondary | ICD-10-CM | POA: Diagnosis not present

## 2022-07-19 DIAGNOSIS — M9903 Segmental and somatic dysfunction of lumbar region: Secondary | ICD-10-CM | POA: Diagnosis not present

## 2022-07-19 DIAGNOSIS — S39012D Strain of muscle, fascia and tendon of lower back, subsequent encounter: Secondary | ICD-10-CM | POA: Diagnosis not present

## 2022-07-19 MED ORDER — PREDNISONE 50 MG PO TABS: ORAL_TABLET | ORAL | 0 refills | Status: DC

## 2022-07-19 NOTE — Assessment & Plan Note (Addendum)
Doing well.  Discussed icing regimen and home exercises.  Which activities to do which ones to avoid discussed which activities  RTC in 6-8 weeks

## 2022-07-19 NOTE — Patient Instructions (Addendum)
Prednisone Refilled Keep being active If doing well can move appointment back

## 2022-07-20 ENCOUNTER — Other Ambulatory Visit (HOSPITAL_COMMUNITY): Payer: Self-pay

## 2022-07-21 ENCOUNTER — Other Ambulatory Visit: Payer: Self-pay | Admitting: Family Medicine

## 2022-09-26 NOTE — Progress Notes (Deleted)
  [place] Phone: [phone] Subjective:    I'm seeing this patient by the request  of:  [name], NP  CC: Low back and hip pain  [id number]  Maxwell [name] is a 41 y.o. male coming in with complaint of back and neck pain. OMT 07/19/2022.  Patient was seen 10 weeks ago and was to continue with core strengthening.  Patient states   Medications patient has been prescribed: prednisone  Taking:  Patient's previous imaging includes x-rays of the lumbar and hip that showed only some mild narrowing at the L5-S1 area.       Reviewed prior external information including notes and imaging from previsou exam, outside providers and external EMR if available.   As well as notes that were available from care everywhere and other healthcare systems.  Past medical history, social, surgical and family history all reviewed in electronic medical record.  No pertanent information unless stated regarding to the chief complaint.   Past Medical History:  Diagnosis Date   Asthma    childhood asthma    No Known Allergies   Review of Systems:  No headache, visual changes, nausea, vomiting, diarrhea, constipation, dizziness, abdominal pain, skin rash, fevers, chills, night sweats, weight loss, swollen lymph nodes, body aches, joint swelling, chest pain, shortness of breath, mood changes. POSITIVE muscle aches  Objective  There were no vitals taken for this visit.   General: No apparent distress alert and oriented x3 mood and affect normal, dressed appropriately.  HEENT: Pupils equal, extraocular movements intact  Respiratory: Patient's speak in full sentences and does not appear short of breath  Cardiovascular: No lower extremity edema, non tender, no erythema  Gait MSK:  Back   Osteopathic findings  T9 extended rotated and side bent left L2 flexed rotated and side bent right Sacrum right on right        Assessment and Plan:  No problem-specific Assessment & Plan notes found for this encounter.    Nonallopathic problems  Decision today to treat with OMT was based on Physical Exam  After verbal consent patient was treated with HVLA, ME, FPR techniques in  thoracic, lumbar, and sacral  areas  Patient tolerated the procedure well with improvement in symptoms  Patient given exercises, stretches and lifestyle modifications  See medications in patient instructions if given  Patient will follow up in 4-8 weeks      The above documentation has been reviewed and is accurate and complete Lyndal Pulley, DO        Note: This dictation was prepared with Dragon dictation along with smaller phrase technology. Any transcriptional errors that result from this process are unintentional.

## 2022-10-01 ENCOUNTER — Ambulatory Visit: Payer: 59 | Admitting: Family Medicine

## 2022-11-06 NOTE — Progress Notes (Deleted)
  [name] Phone: [phone] Subjective:    I'm seeing this patient by the request  of:  [name], NP  CC: back and neck pain follow up   [id number]  Maxwell [name] is a 41 y.o. male coming in with complaint of back and neck pain. OMT 07/19/2022. Patient states   Medications patient has been prescribed: Meloxicam  Taking:         Reviewed prior external information including notes and imaging from previsou exam, outside providers and external EMR if available.   As well as notes that were available from care everywhere and other healthcare systems.  Past medical history, social, surgical and family history all reviewed in electronic medical record.  No pertanent information unless stated regarding to the chief complaint.   Past Medical History:  Diagnosis Date   Asthma    childhood asthma    No Known Allergies   Review of Systems:  No headache, visual changes, nausea, vomiting, diarrhea, constipation, dizziness, abdominal pain, skin rash, fevers, chills, night sweats, weight loss, swollen lymph nodes, body aches, joint swelling, chest pain, shortness of breath, mood changes. POSITIVE muscle aches  Objective  There were no vitals taken for this visit.   General: No apparent distress alert and oriented x3 mood and affect normal, dressed appropriately.  HEENT: Pupils equal, extraocular movements intact  Respiratory: Patient's speak in full sentences and does not appear short of breath  Cardiovascular: No lower extremity edema, non tender, no erythema  Gait MSK:  Back   Osteopathic findings  C2 flexed rotated and side bent right C6 flexed rotated and side bent left T3 extended rotated and side bent right inhaled rib T9 extended rotated and side bent left L2 flexed rotated and side bent right Sacrum right on right       Assessment and Plan:  No problem-specific Assessment &  Plan notes found for this encounter.    Nonallopathic problems  Decision today to treat with OMT was based on Physical Exam  After verbal consent patient was treated with HVLA, ME, FPR techniques in cervical, rib, thoracic, lumbar, and sacral  areas  Patient tolerated the procedure well with improvement in symptoms  Patient given exercises, stretches and lifestyle modifications  See medications in patient instructions if given  Patient will follow up in 4-8 weeks      The above documentation has been reviewed and is accurate and complete Lyndal Pulley, DO        Note: This dictation was prepared with Dragon dictation along with smaller phrase technology. Any transcriptional errors that result from this process are unintentional.

## 2022-11-08 ENCOUNTER — Ambulatory Visit: Payer: 59 | Admitting: Family Medicine

## 2022-12-11 NOTE — Progress Notes (Deleted)
  [place] Phone: [phone] Subjective:    I'm seeing this patient by the request  of:  [name], NP  CC: back and neck pain   [id number]  Maxwell [name] is a 42 y.o. male coming in with complaint of back and neck pain. OMT 07/19/2022. Patient states   Medications patient has been prescribed: Meloxicam  Taking:         Reviewed prior external information including notes and imaging from previsou exam, outside providers and external EMR if available.   As well as notes that were available from care everywhere and other healthcare systems.  Past medical history, social, surgical and family history all reviewed in electronic medical record.  No pertanent information unless stated regarding to the chief complaint.   Past Medical History:  Diagnosis Date   Asthma    childhood asthma    No Known Allergies   Review of Systems:  No headache, visual changes, nausea, vomiting, diarrhea, constipation, dizziness, abdominal pain, skin rash, fevers, chills, night sweats, weight loss, swollen lymph nodes, body aches, joint swelling, chest pain, shortness of breath, mood changes. POSITIVE muscle aches  Objective  There were no vitals taken for this visit.   General: No apparent distress alert and oriented x3 mood and affect normal, dressed appropriately.  HEENT: Pupils equal, extraocular movements intact  Respiratory: Patient's speak in full sentences and does not appear short of breath  Cardiovascular: No lower extremity edema, non tender, no erythema  Gait MSK:  Back   Osteopathic findings  C2 flexed rotated and side bent right C6 flexed rotated and side bent left T3 extended rotated and side bent right inhaled rib T9 extended rotated and side bent left L2 flexed rotated and side bent right Sacrum right on right       Assessment and Plan:  No problem-specific Assessment & Plan notes  found for this encounter.    Nonallopathic problems  Decision today to treat with OMT was based on Physical Exam  After verbal consent patient was treated with HVLA, ME, FPR techniques in cervical, rib, thoracic, lumbar, and sacral  areas  Patient tolerated the procedure well with improvement in symptoms  Patient given exercises, stretches and lifestyle modifications  See medications in patient instructions if given  Patient will follow up in 4-8 weeks     The above documentation has been reviewed and is accurate and complete Lyndal Pulley, DO         Note: This dictation was prepared with Dragon dictation along with smaller phrase technology. Any transcriptional errors that result from this process are unintentional.

## 2022-12-13 ENCOUNTER — Ambulatory Visit: Payer: 59 | Admitting: Family Medicine

## 2023-01-21 ENCOUNTER — Ambulatory Visit (INDEPENDENT_AMBULATORY_CARE_PROVIDER_SITE_OTHER): Payer: 59 | Admitting: Family Medicine

## 2023-01-21 VITALS — BP 142/68 | HR 68 | Ht 66.0 in | Wt 212.0 lb

## 2023-01-21 DIAGNOSIS — M9903 Segmental and somatic dysfunction of lumbar region: Secondary | ICD-10-CM | POA: Diagnosis not present

## 2023-01-21 DIAGNOSIS — S39012D Strain of muscle, fascia and tendon of lower back, subsequent encounter: Secondary | ICD-10-CM

## 2023-01-21 DIAGNOSIS — M9902 Segmental and somatic dysfunction of thoracic region: Secondary | ICD-10-CM | POA: Diagnosis not present

## 2023-01-21 DIAGNOSIS — M9904 Segmental and somatic dysfunction of sacral region: Secondary | ICD-10-CM

## 2023-01-21 NOTE — Assessment & Plan Note (Signed)
Still has some muscle strains noted.  Patient still has tenderness on the right side of the paraspinal musculature.  Still very good which is good.  Does need to improve core strengthening.  Discussed icing regimen and home exercises.  Follow-up again in 6 to 8 weeks

## 2023-01-21 NOTE — Patient Instructions (Signed)
Good to see you Glad yoga is working out More cardio See me again in 2-3 months

## 2023-01-21 NOTE — Progress Notes (Signed)
  [name] Phone: [phone] Subjective:   Maxwell Maxwell [name], am serving as a scribe for Dr. Hulan [name].  I'm seeing this patient by the request  of:  Maxwell Alar, NP  CC: Neck and back pain  [id number]  Maxwell Maxwell [name] is a 42 y.o. male coming in with complaint of back and neck pain. OMT on 07/19/2022. Patient states that he started doing yoga which has been helping.  Has not noticed some tightness recently but nothing that has been severe enough to stop him from activity.  Medications patient has been prescribed: Prednisone Mobic  Taking:         Reviewed prior external information including notes and imaging from previsou exam, outside providers and external EMR if available.   As well as notes that were available from care everywhere and other healthcare systems.  Past medical history, social, surgical and family history all reviewed in electronic medical record.  Maxwell [name] pertanent information unless stated regarding to the chief complaint.   Past Medical History:  Diagnosis Date   Asthma    childhood asthma    Maxwell [name] Known Allergies   Review of Systems:  Maxwell [name] headache, visual changes, nausea, vomiting, diarrhea, constipation, dizziness, abdominal pain, skin rash, fevers, chills, night sweats, weight loss, swollen lymph nodes, body aches, joint swelling, chest pain, shortness of breath, mood changes. POSITIVE muscle aches  Objective  Blood pressure (!) 142/68, pulse 68, height 5' 6"$  (1.676 m), weight 212 lb (96.2 kg), SpO2 98 %.   General: Maxwell [name] apparent distress alert and oriented x3 mood and affect normal, dressed appropriately.  HEENT: Pupils equal, extraocular movements intact  Respiratory: Patient's speak in full sentences and does not appear short of breath  Cardiovascular: Maxwell [name] lower extremity edema, non tender, Maxwell [name] erythema  Still some loss of lordosis still has some tightness on the right  side  Osteopathic findings  T6 extended rotated and side bent left L1 flexed rotated and side bent right Sacrum right on right       Assessment and Plan:  Lumbar strain Still has some muscle strains noted.  Patient still has tenderness on the right side of the paraspinal musculature.  Still very good which is good.  Does need to improve core strengthening.  Discussed icing regimen and home exercises.  Follow-up again in 6 to 8 weeks    Nonallopathic problems  Decision today to treat with OMT was based on Physical Exam  After verbal consent patient was treated with HVLA, ME, FPR techniques in  thoracic, lumbar, and sacral  areas  Patient tolerated the procedure well with improvement in symptoms  Patient given exercises, stretches and lifestyle modifications  See medications in patient instructions if given  Patient will follow up in 4-8 weeks    The above documentation has been reviewed and is accurate and complete Maxwell Pulley, DO          Note: This dictation was prepared with Dragon dictation along with smaller phrase technology. Any transcriptional errors that result from this process are unintentional.

## 2023-03-20 NOTE — Progress Notes (Unsigned)
  [name] [address] [place] [address] Phone: [phone] Subjective:   INadine Counts, am serving as a scribe for Dr. Antoine Primas.  I'm seeing this patient by the request  of:  Sandford Craze, NP  CC: Low back pain follow-up  [id number]  Maxwell [name] is a 42 y.o. male coming in with complaint of back and neck pain. OMT on 01/21/2023. Patient states doing well. Taken up yoga and doing much better. Here for manipulation.   Medications patient has been prescribed:   Taking:       Past Medical History:  Diagnosis Date   Asthma    childhood asthma      Objective  Blood pressure (!) 136/90, pulse 71, height  (1.676 m), weight 206 lb (93.4 kg), SpO2 97 %.   General: No apparent distress alert and oriented x3 mood and affect normal, dressed appropriately.  HEENT: Pupils equal, extraocular movements intact  Respiratory: Patient's speak in full sentences and does not appear short of breath  Cardiovascular: No lower extremity edema, non tender, no erythema  Low back is doing significantly better.  Does have some tightness noted in the musculature.  Nothing seems to go out of place.  Some tightness with FABER test but very minimal.        Assessment and Plan:  Lumbar strain Patient is unremarkably well overall.  Patient has lost weight and encouraged him to continue to do more core strengthening.  At this point patient to follow-up with me in 3 months for further evaluation and treatment.                 Note: This dictation was prepared with Dragon dictation along with smaller phrase technology. Any transcriptional errors that result from this process are unintentional.

## 2023-03-21 ENCOUNTER — Encounter: Payer: Self-pay | Admitting: Family Medicine

## 2023-03-21 ENCOUNTER — Ambulatory Visit (INDEPENDENT_AMBULATORY_CARE_PROVIDER_SITE_OTHER): Payer: 59 | Admitting: Family Medicine

## 2023-03-21 VITALS — BP 136/90 | HR 71 | Ht 66.0 in | Wt 206.0 lb

## 2023-03-21 DIAGNOSIS — S39012D Strain of muscle, fascia and tendon of lower back, subsequent encounter: Secondary | ICD-10-CM

## 2023-03-21 NOTE — Assessment & Plan Note (Signed)
Patient is unremarkably well overall.  Patient has lost weight and encouraged him to continue to do more core strengthening.  At this point patient to follow-up with me in 3 months for further evaluation and treatment.

## 2023-03-21 NOTE — Patient Instructions (Addendum)
Make sure theres no ashwagandha Otherwise be proud of what you've done See you again in 3 months

## 2023-03-29 ENCOUNTER — Ambulatory Visit (INDEPENDENT_AMBULATORY_CARE_PROVIDER_SITE_OTHER): Payer: 59 | Admitting: Family

## 2023-03-29 ENCOUNTER — Encounter: Payer: Self-pay | Admitting: Family

## 2023-03-29 VITALS — BP 138/80 | HR 58 | Temp 97.6°F | Ht 66.0 in | Wt 198.0 lb

## 2023-03-29 DIAGNOSIS — R21 Rash and other nonspecific skin eruption: Secondary | ICD-10-CM | POA: Insufficient documentation

## 2023-03-29 DIAGNOSIS — I10 Essential (primary) hypertension: Secondary | ICD-10-CM | POA: Diagnosis not present

## 2023-03-29 MED ORDER — BETAMETHASONE VALERATE 0.1 % EX OINT: 1.0000 | TOPICAL_OINTMENT | Freq: Every day | CUTANEOUS | 2 refills | Status: DC

## 2023-03-29 MED ORDER — PERMETHRIN 5 % EX CREA: TOPICAL_CREAM | CUTANEOUS | 0 refills | Status: DC

## 2023-03-29 MED ORDER — AMLODIPINE BESYLATE 2.5 MG PO TABS: 2.5000 mg | ORAL_TABLET | Freq: Every day | ORAL | 0 refills | Status: DC

## 2023-03-29 NOTE — Progress Notes (Addendum)
Subjective:     Patient ID: Maxwell [name], male    DOB: 05/04/1981, 42 y.o.   MRN: [id number]  Chief Complaint  Patient presents with   Rash    Arms and legs    Rash   Patient is in today with complaint of rash on arms and legs.  Reports that he slept in a hotel on Monday after easter.  Rash began the following day. He reports rash is non-pruritic.    BP Readings from Last 3 Encounters:  03/29/23 138/80  03/21/23 (!) 136/90  01/21/23 (!) 142/68     There are no preventive care reminders to display for this patient.  Past Medical History:  Diagnosis Date   Asthma    childhood asthma    No past surgical history on file.  Family History  Problem Relation Age of Onset   Hypertension Mother    Hypertension Father    Liver disease Sister    Cancer Maternal Uncle        prostate   Diabetes Half-Sister    Heart disease Neg Hx    Kidney disease Neg Hx     Social History   Socioeconomic History   Marital status: Single    Spouse name: Not on file   Number of children: Not on file   Years of education: Not on file   Highest education level: Master's degree (e.g., MA, MS, MEng, MEd, MSW, MBA)  Occupational History   Not on file  Tobacco Use   Smoking status: Never   Smokeless tobacco: Never  Substance and Sexual Activity   Alcohol use: No   Drug use: Never   Sexual activity: Yes    Partners: Female  Other Topics Concern   Not on file  Social History Narrative   Married   No children   Teaching laboratory technician   Bachelors degree   Enjoys basketball   Grew up in [place].   Social Determinants of Health   Financial Resource Strain: Low Risk  (03/28/2023)   Overall Financial Resource Strain (CARDIA)    Difficulty of Paying Living Expenses: Not hard at all  Food Insecurity: No Food Insecurity (03/28/2023)   Hunger Vital Sign    Worried About Running Out of Food in the Last Year: Never true    Ran Out of Food in the Last Year: Never true  Transportation Needs: No  Transportation Needs (03/28/2023)   PRAPARE - Administrator, Civil Service (Medical): No    Lack of Transportation (Non-Medical): No  Physical Activity: Sufficiently Active (03/28/2023)   Exercise Vital Sign    Days of Exercise per Week: 5 days    Minutes of Exercise per Session: 30 min  Stress: No Stress Concern Present (03/28/2023)   [employer] of Occupational Health - Occupational Stress Questionnaire    Feeling of Stress : Not at all  Social Connections: Moderately Integrated (03/28/2023)   Social Connection and Isolation Panel [NHANES]    Frequency of Communication with Friends and Family: More than three times a week    Frequency of Social Gatherings with Friends and Family: Once a week    Attends Religious Services: More than 4 times per year    Active Member of [employer] or Organizations: No    Attends Engineer, structural: Not on file    Marital Status: Married  Catering manager Violence: Not on file    Outpatient Medications Prior to Visit  Medication Sig Dispense Refill   meloxicam (  MOBIC) 7.5 MG tablet TAKE 1 TABLET BY MOUTH EVERY DAY 90 tablet 0   No facility-administered medications prior to visit.    No Known Allergies  Review of Systems  Skin:  Positive for rash.   See HPI    Objective:    Physical Exam Constitutional:      Appearance: Normal appearance.  Cardiovascular:     Rate and Rhythm: Normal rate.  Pulmonary:     Effort: Pulmonary effort is normal.  Skin:    Findings: Rash (pustular (pimple like) lesions concentrated on bilateral thighs. mild on arms) present.  Neurological:     Mental Status: He is alert.     BP 138/80   Pulse (!) 58   Temp 97.6 F (36.4 C)   Ht 5\' 6"  (1.676 m)   Wt 198 lb (89.8 kg)   SpO2 100%   BMI 31.96 kg/m  Wt Readings from Last 3 Encounters:  03/29/23 198 lb (89.8 kg)  03/21/23 206 lb (93.4 kg)  01/21/23 212 lb (96.2 kg)       Assessment & Plan:   Problem List Items Addressed This  Visit       Unprioritized   Skin rash - Primary    New.  Could be scabies.  Will rx with Permethrin cream and betamethasone cream. He will let me know if symptoms are not improved in 2 weeks.       Primary hypertension    BP's have been consistently borderline elevated. He will continue to work on low sodium diet and exercise and will add amlodipine 2.5mg  once daily.       Relevant Medications   amLODipine (NORVASC) 2.5 MG tablet    I have discontinued Moris Dlouhy's meloxicam. I am also having him start on permethrin, betamethasone valerate ointment, and amLODipine.  Meds ordered this encounter  Medications   permethrin (ELIMITE) 5 % cream    Sig: Massage head to toe, leave on overnight, wash off in the AM.    Dispense:  60 g    Refill:  0    Order Specific Question:   Supervising Provider    Answer:   Danise Edge A [[id number]]   betamethasone valerate ointment (VALISONE) 0.1 %    Sig: Apply 1 Application topically daily.    Dispense:  30 g    Refill:  2    Order Specific Question:   Supervising Provider    Answer:   Danise Edge A [[id number]]   amLODipine (NORVASC) 2.5 MG tablet    Sig: Take 1 tablet (2.5 mg total) by mouth daily.    Dispense:  90 tablet    Refill:  0    Order Specific Question:   Supervising Provider    Answer:   Danise Edge A [[id number]]

## 2023-03-29 NOTE — Assessment & Plan Note (Signed)
New.  Could be scabies.  Will rx with Permethrin cream and betamethasone cream. He will let me know if symptoms are not improved in 2 weeks.

## 2023-03-29 NOTE — Assessment & Plan Note (Signed)
BP's have been consistently borderline elevated. He will continue to work on low sodium diet and exercise and will add amlodipine 2.5mg  once daily.

## 2023-03-29 NOTE — Addendum Note (Signed)
Addended by: Sandford Craze on: 03/29/2023 08:11 AM   Modules accepted: Level of Service

## 2023-04-30 ENCOUNTER — Telehealth: Payer: Self-pay | Admitting: Family

## 2023-04-30 DIAGNOSIS — R21 Rash and other nonspecific skin eruption: Secondary | ICD-10-CM

## 2023-04-30 MED ORDER — PERMETHRIN 5 % EX CREA: TOPICAL_CREAM | CUTANEOUS | 0 refills | Status: DC

## 2023-04-30 MED ORDER — BETAMETHASONE VALERATE 0.1 % EX OINT: 1.0000 | TOPICAL_OINTMENT | Freq: Every day | CUTANEOUS | 2 refills | Status: DC

## 2023-04-30 NOTE — Telephone Encounter (Signed)
Prescription Request  04/30/2023  Is this a "Controlled Substance" medicine? No  LOV: 03/29/2023  What is the name of the medication or equipment? permethrin (ELIMITE) 5 % cream [[id number]]   Have you contacted your pharmacy to request a refill? No   Which pharmacy would you like this sent to?  [id number] [place], [place] - [address] [place] [address] Phone: [phone] Fax: [phone]   Patient notified that their request is being sent to the clinical staff for review and that they should receive a response within 2 business days.   Please advise at Mobile [phone] (mobile)

## 2023-04-30 NOTE — Addendum Note (Signed)
Addended by: Sandford Craze on: 04/30/2023 11:46 AM   Modules accepted: Orders

## 2023-04-30 NOTE — Telephone Encounter (Signed)
Med refill request Patient reports he used ointment for 2 days and cream for about 2 week. Ran out and rash is coming back "not as severe".  Please advise if refill needed to use for longer time or change in treatment.

## 2023-04-30 NOTE — Telephone Encounter (Signed)
Spoke to pt. He thought that permethrin helped initially as well as the steroid cream.  Requesting refills on both. Has appointment with dermatology on 6/24 and requesting formal referral to them for insurance purposes.

## 2023-05-27 ENCOUNTER — Encounter: Payer: Self-pay | Admitting: Family

## 2023-05-27 ENCOUNTER — Ambulatory Visit (INDEPENDENT_AMBULATORY_CARE_PROVIDER_SITE_OTHER): Payer: 59 | Admitting: Family

## 2023-05-27 VITALS — BP 137/88 | HR 52 | Ht 66.0 in | Wt 190.4 lb

## 2023-05-27 DIAGNOSIS — R52 Pain, unspecified: Secondary | ICD-10-CM | POA: Diagnosis not present

## 2023-05-27 DIAGNOSIS — I10 Essential (primary) hypertension: Secondary | ICD-10-CM | POA: Diagnosis not present

## 2023-05-27 DIAGNOSIS — Z0000 Encounter for general adult medical examination without abnormal findings: Secondary | ICD-10-CM | POA: Diagnosis not present

## 2023-05-27 DIAGNOSIS — R21 Rash and other nonspecific skin eruption: Secondary | ICD-10-CM | POA: Diagnosis not present

## 2023-05-27 DIAGNOSIS — Z125 Encounter for screening for malignant neoplasm of prostate: Secondary | ICD-10-CM | POA: Diagnosis not present

## 2023-05-27 LAB — COMPREHENSIVE METABOLIC PANEL
ALT: 13 U/L (ref 0–53)
AST: 23 U/L (ref 0–37)
Albumin: 4.2 g/dL (ref 3.5–5.2)
Alkaline Phosphatase: 74 U/L (ref 39–117)
BUN: 21 mg/dL (ref 6–23)
CO2: 31 mEq/L (ref 19–32)
Calcium: 9.7 mg/dL (ref 8.4–10.5)
Chloride: 103 mEq/L (ref 96–112)
Creatinine, Ser: 1.35 mg/dL (ref 0.40–1.50)
GFR: 65.19 mL/min (ref >60.00)
Glucose, Bld: 91 mg/dL (ref 70–99)
Potassium: 4.5 mEq/L (ref 3.5–5.1)
Sodium: 139 mEq/L (ref 135–145)
Total Bilirubin: 0.8 mg/dL (ref 0.2–1.2)
Total Protein: 7.3 g/dL (ref 6.0–8.3)

## 2023-05-27 LAB — PSA: PSA: 0.43 ng/mL (ref 0.10–4.00)

## 2023-05-27 MED ORDER — MELOXICAM 7.5 MG PO TABS: 7.5000 mg | ORAL_TABLET | Freq: Every day | ORAL | 0 refills | Status: DC | PRN

## 2023-05-27 NOTE — Assessment & Plan Note (Signed)
BP at goal, continue current dose of amlodipine.

## 2023-05-27 NOTE — Assessment & Plan Note (Signed)
Significant weight loss since last visit in April 2024. Patient reports adherence to a healthy diet and regular exercise. -Encouraged continuation of healthy lifestyle habits. -Order PSA and kidney function tests. -Encouraged flu and COVID booster vaccines in September 2024.

## 2023-05-27 NOTE — Assessment & Plan Note (Signed)
Improved following use of elimite.  Monitor.

## 2023-05-27 NOTE — Progress Notes (Signed)
l\\                                                                                                                                                                                                                                                                                                                                                                                                                                                                                                                        Subjective:     Patient ID: Maxwell [name], male    DOB: 02/21/1981, 42 y.o.   MRN: [id number]  Chief Complaint  Patient presents with   Annual Exam    HPI  Discussed the use of AI scribe software for clinical note transcription with the patient, who gave verbal consent to proceed.  History of Present Illness   The patient presents for a routine physical. He reports no new symptoms or concerns. He has a rash that has improved without dermatology consultation. He has allergies, but denies taking any medications  for them. He has hypertension, controlled with amlodipine 2.5mg , which he admits to not taking daily. He has lost weight since his last visit in April, attributing it to a healthy diet and regular exercise, including circuit classes and cardio. He is currently undergoing the IVF process with his wife, with an embryo transfer expected this month. He denies any current cough or cold symptoms, chest pain, shortness of breath, digestive concerns, urinary difficulties, unusual muscle or joint pain, frequent headaches, and concerns about depression or anxiety.      Patient presents today for complete physical.  Immunizations: tetanus up to date Diet: healthy Wt Readings from Last 3 Encounters:  05/27/23 190 lb 6.4 oz (86.4  kg)  03/29/23 198 lb (89.8 kg)  03/21/23 206 lb (93.4 kg)  Exercise: circuit/cardio Vision: due Dental:  up to date   Health Maintenance Due  Topic Date Due   COVID-19 Vaccine (4 - 2023-24 season) 07/27/2022    Past Medical History:  Diagnosis Date   Asthma    childhood asthma    History reviewed. No pertinent surgical history.  Family History  Problem Relation Age of Onset   Hypertension Mother    Hypertension Father    Liver disease Sister    Cancer Maternal Uncle        prostate   Diabetes Half-Sister    Heart disease Neg Hx    Kidney disease Neg Hx     Social History   Socioeconomic History   Marital status: Single    Spouse name: Not on file   Number of children: Not on file   Years of education: Not on file   Highest education level: Master's degree (e.g., MA, MS, MEng, MEd, MSW, MBA)  Occupational History   Not on file  Tobacco Use   Smoking status: Never   Smokeless tobacco: Never  Substance and Sexual Activity   Alcohol use: No   Drug use: Never   Sexual activity: Yes    Partners: Female  Other Topics Concern   Not on file  Social History Narrative   Married   No children   Teaching laboratory technician   Bachelors degree   Enjoys basketball   Grew up in [place].   Social Determinants of Health   Financial Resource Strain: Low Risk  (03/28/2023)   Overall Financial Resource Strain (CARDIA)    Difficulty of Paying Living Expenses: Not hard at all  Food Insecurity: No Food Insecurity (03/28/2023)   Hunger Vital Sign    Worried About Running Out of Food in the Last Year: Never true    Ran Out of Food in the Last Year: Never true  Transportation Needs: No Transportation Needs (03/28/2023)   PRAPARE - Administrator, Civil Service (Medical): No    Lack of Transportation (Non-Medical): No  Physical Activity: Sufficiently Active (03/28/2023)   Exercise Vital Sign    Days of Exercise per Week: 5 days    Minutes of Exercise per Session: 30 min   Stress: No Stress Concern Present (03/28/2023)   [employer] of Occupational Health - Occupational Stress Questionnaire    Feeling of Stress : Not at all  Social Connections: Moderately Integrated (03/28/2023)   Social Connection and Isolation Panel [NHANES]    Frequency of Communication with Friends and Family: More than three times a week    Frequency of Social Gatherings with Friends and Family: Once a week    Attends Religious Services: More than 4 times per year  Active Member of Clubs or Organizations: No    Attends Engineer, structural: Not on file    Marital Status: Married  Catering manager Violence: Not on file    Outpatient Medications Prior to Visit  Medication Sig Dispense Refill   amLODipine (NORVASC) 2.5 MG tablet Take 1 tablet (2.5 mg total) by mouth daily. 90 tablet 0   betamethasone valerate ointment (VALISONE) 0.1 % Apply 1 Application topically daily. 30 g 2   permethrin (ELIMITE) 5 % cream Massage head to toe, leave on overnight, wash off in the AM. 60 g 0   No facility-administered medications prior to visit.    No Known Allergies  Review of Systems  Constitutional:  Positive for weight loss (on purpuse).  HENT:  Negative for congestion.   Respiratory:  Negative for cough.   Cardiovascular:  Negative for chest pain.  Gastrointestinal:  Negative for blood in stool, constipation and diarrhea.  Genitourinary:  Negative for dysuria, frequency and hematuria.  Musculoskeletal:  Negative for joint pain and myalgias.  Neurological:  Negative for headaches.  Psychiatric/Behavioral:         Denies depression/anxiety       Objective:    Physical Exam   BP 137/88 (BP Location: Right Arm, Patient Position: Sitting, Cuff Size: Large) Comment: R arm  Pulse (!) 52   Ht 5\' 6"  (1.676 m)   Wt 190 lb 6.4 oz (86.4 kg)   SpO2 99%   BMI 30.73 kg/m  Wt Readings from Last 3 Encounters:  05/27/23 190 lb 6.4 oz (86.4 kg)  03/29/23 198 lb (89.8 kg)   03/21/23 206 lb (93.4 kg)   Physical Exam  Constitutional: He is oriented to person, place, and time. He appears well-developed and well-nourished. No distress.  HENT:  Head: Normocephalic and atraumatic.  Right Ear: Tympanic membrane and ear canal normal.  Left Ear: Tympanic membrane and ear canal normal.  Mouth/Throat: Oropharynx is clear and moist.  Eyes: Pupils are equal, round, and reactive to light. No scleral icterus.  Neck: Normal range of motion. No thyromegaly present.  Cardiovascular: Normal rate and regular rhythm.   No murmur heard. Pulmonary/Chest: Effort normal and breath sounds normal. No respiratory distress. He has no wheezes. He has no rales. He exhibits no tenderness.  Abdominal: Soft. Bowel sounds are normal. He exhibits no distension and no mass. There is no tenderness. There is no rebound and no guarding.  Musculoskeletal: He exhibits no edema.  Lymphadenopathy:    He has no cervical adenopathy.  Neurological: He is alert and oriented to person, place, and time. He has normal patellar reflexes. He exhibits normal muscle tone. Coordination normal.  Skin: Skin is warm and dry.  Psychiatric: He has a normal mood and affect. His behavior is normal. Judgment and thought content normal.           Assessment & Plan:       Assessment & Plan:   Problem List Items Addressed This Visit       Unprioritized   Skin rash    Improved following use of elimite.  Monitor.       Routine general medical examination at a health care facility    Significant weight loss since last visit in April 2024. Patient reports adherence to a healthy diet and regular exercise. -Encouraged continuation of healthy lifestyle habits. -Order PSA and kidney function tests. -Encouraged flu and COVID booster vaccines in September 2024.      Primary hypertension  BP at goal, continue current dose of amlodipine.       Relevant Orders   Comp Met (CMET)   Other Visit Diagnoses      Screening for prostate cancer    -  Primary   Relevant Orders   PSA      Pain Management: Request for Meloxicam refill for occasional leg pain. -[facility] for [facility].  I have discontinued Yehia Formisano's permethrin. I am also having him start on meloxicam. Additionally, I am having him maintain his amLODipine and betamethasone valerate ointment.  Meds ordered this encounter  Medications   meloxicam (MOBIC) 7.5 MG tablet    Sig: Take 1 tablet (7.5 mg total) by mouth daily as needed for pain.    Dispense:  14 tablet    Refill:  0    Order Specific Question:   Supervising Provider    Answer:   Danise Edge A [[id number]]

## 2023-06-12 NOTE — Progress Notes (Deleted)
  [name] [address] [place] [address] Phone: [phone] Subjective:    I'm seeing this patient by the request  of:  Sandford Craze, NP  CC:   [id number]  Maxwell [name] is a 42 y.o. male coming in with complaint of back and neck pain. OMT on 03/21/2023. Patient states   Medications patient has been prescribed:   Taking:         Reviewed prior external information including notes and imaging from previsou exam, outside providers and external EMR if available.   As well as notes that were available from care everywhere and other healthcare systems.  Past medical history, social, surgical and family history all reviewed in electronic medical record.  No pertanent information unless stated regarding to the chief complaint.   Past Medical History:  Diagnosis Date   Asthma    childhood asthma    No Known Allergies   Review of Systems:  No headache, visual changes, nausea, vomiting, diarrhea, constipation, dizziness, abdominal pain, skin rash, fevers, chills, night sweats, weight loss, swollen lymph nodes, body aches, joint swelling, chest pain, shortness of breath, mood changes. POSITIVE muscle aches  Objective  There were no vitals taken for this visit.   General: No apparent distress alert and oriented x3 mood and affect normal, dressed appropriately.  HEENT: Pupils equal, extraocular movements intact  Respiratory: Patient's speak in full sentences and does not appear short of breath  Cardiovascular: No lower extremity edema, non tender, no erythema  Gait MSK:  Back   Osteopathic findings  C2 flexed rotated and side bent right C6 flexed rotated and side bent left T3 extended rotated and side bent right inhaled rib T9 extended rotated and side bent left L2 flexed rotated and side bent right Sacrum right on right       Assessment and Plan:  No problem-specific Assessment & Plan notes found for this encounter.     Nonallopathic problems  Decision today to treat with OMT was based on Physical Exam  After verbal consent patient was treated with HVLA, ME, FPR techniques in cervical, rib, thoracic, lumbar, and sacral  areas  Patient tolerated the procedure well with improvement in symptoms  Patient given exercises, stretches and lifestyle modifications  See medications in patient instructions if given  Patient will follow up in 4-8 weeks             Note: This dictation was prepared with Dragon dictation along with smaller phrase technology. Any transcriptional errors that result from this process are unintentional.

## 2023-06-20 ENCOUNTER — Ambulatory Visit: Payer: 59 | Admitting: Family Medicine

## 2023-06-27 ENCOUNTER — Other Ambulatory Visit: Payer: Self-pay | Admitting: Family

## 2023-07-08 ENCOUNTER — Telehealth: Payer: Self-pay | Admitting: Family

## 2023-07-08 ENCOUNTER — Other Ambulatory Visit: Payer: Self-pay

## 2023-07-08 MED ORDER — BETAMETHASONE VALERATE 0.1 % EX OINT: 1.0000 | TOPICAL_OINTMENT | Freq: Every day | CUTANEOUS | 2 refills | Status: DC

## 2023-07-08 MED ORDER — MELOXICAM 7.5 MG PO TABS: 7.5000 mg | ORAL_TABLET | Freq: Every day | ORAL | 0 refills | Status: DC | PRN

## 2023-07-08 NOTE — Telephone Encounter (Signed)
Refills sent

## 2023-07-08 NOTE — Telephone Encounter (Signed)
Pt needs a refill on his meloxicam and his betamethasone valerate ointment. [facility] [address]

## 2023-07-25 NOTE — Progress Notes (Deleted)
[name] [address] [place] [address] Phone: [phone] Subjective:    I'm seeing this patient by the request  of:  Sandford Craze, NP  CC:   [id number]  03/21/2023 Patient is unremarkably well overall. Patient has lost weight and encouraged him to continue to do more core strengthening. At this point patient to follow-up with me in 3 months for further evaluation and treatment.   Updated 07/31/2023 Maxwell [name] is a 42 y.o. male coming in with complaint of lumbar strain      Past Medical History:  Diagnosis Date   Asthma    childhood asthma   No past surgical history on file. Social History   Socioeconomic History   Marital status: Single    Spouse name: Not on file   Number of children: Not on file   Years of education: Not on file   Highest education level: Master's degree (e.g., MA, MS, MEng, MEd, MSW, MBA)  Occupational History   Not on file  Tobacco Use   Smoking status: Never   Smokeless tobacco: Never  Substance and Sexual Activity   Alcohol use: No   Drug use: Never   Sexual activity: Yes    Partners: Female  Other Topics Concern   Not on file  Social History Narrative   Married   No children   Biochemist, clinical degree   Enjoys basketball   Grew up in [place].   Social Determinants of Health   Financial Resource Strain: Low Risk  (03/28/2023)   Overall Financial Resource Strain (CARDIA)    Difficulty of Paying Living Expenses: Not hard at all  Food Insecurity: No Food Insecurity (03/28/2023)   Hunger Vital Sign    Worried About Running Out of Food in the Last Year: Never true    Ran Out of Food in the Last Year: Never true  Transportation Needs: No Transportation Needs (03/28/2023)   PRAPARE - Administrator, Civil Service (Medical): No    Lack of Transportation (Non-Medical): No  Physical Activity: Sufficiently Active (03/28/2023)   Exercise Vital Sign    Days of Exercise per  Week: 5 days    Minutes of Exercise per Session: 30 min  Stress: No Stress Concern Present (03/28/2023)   [employer] of Occupational Health - Occupational Stress Questionnaire    Feeling of Stress : Not at all  Social Connections: Moderately Integrated (03/28/2023)   Social Connection and Isolation Panel [NHANES]    Frequency of Communication with Friends and Family: More than three times a week    Frequency of Social Gatherings with Friends and Family: Once a week    Attends Religious Services: More than 4 times per year    Active Member of [employer] or Organizations: No    Attends Engineer, structural: Not on file    Marital Status: Married   No Known Allergies Family History  Problem Relation Age of Onset   Hypertension Mother    Hypertension Father    Liver disease Sister    Cancer Maternal Uncle        prostate   Diabetes Half-Sister    Heart disease Neg Hx    Kidney disease Neg Hx      Current Outpatient Medications (Cardiovascular):    amLODipine (NORVASC) 2.5 MG tablet, Take 1 tablet (2.5 mg total) by mouth daily.   Current Outpatient Medications (Analgesics):    meloxicam (MOBIC) 7.5 MG tablet, Take 1  tablet (7.5 mg total) by mouth daily as needed for pain.   Current Outpatient Medications (Other):    betamethasone valerate ointment (VALISONE) 0.1 %, Apply 1 Application topically daily.   Reviewed prior external information including notes and imaging from  primary care provider As well as notes that were available from care everywhere and other healthcare systems.  Past medical history, social, surgical and family history all reviewed in electronic medical record.  No pertanent information unless stated regarding to the chief complaint.   Review of Systems:  No headache, visual changes, nausea, vomiting, diarrhea, constipation, dizziness, abdominal pain, skin rash, fevers, chills, night sweats, weight loss, swollen lymph nodes, body aches, joint  swelling, chest pain, shortness of breath, mood changes. POSITIVE muscle aches  Objective  There were no vitals taken for this visit.   General: No apparent distress alert and oriented x3 mood and affect normal, dressed appropriately.  HEENT: Pupils equal, extraocular movements intact  Respiratory: Patient's speak in full sentences and does not appear short of breath  Cardiovascular: No lower extremity edema, non tender, no erythema      Impression and Recommendations:

## 2023-07-31 ENCOUNTER — Ambulatory Visit: Payer: 59 | Admitting: Family Medicine

## 2023-08-29 ENCOUNTER — Ambulatory Visit: Payer: 59 | Admitting: Family Medicine

## 2023-10-07 ENCOUNTER — Telehealth: Payer: Self-pay | Admitting: Family

## 2023-10-07 NOTE — Telephone Encounter (Signed)
Prescription Request  10/07/2023  Is this a "Controlled Substance" medicine? No  LOV: 05/27/2023  What is the name of the medication or equipment?  meloxicam (MOBIC) 7.5 MG tablet   Have you contacted your pharmacy to request a refill? No   Which pharmacy would you like this sent to?  [id number] [place], [place] - [address] [place] [address] Phone: [phone] Fax: [phone]      Patient notified that their request is being sent to the clinical staff for review and that they should receive a response within 2 business days.   Please advise at Mobile [phone] (mobile)

## 2023-10-08 MED ORDER — MELOXICAM 7.5 MG PO TABS: 7.5000 mg | ORAL_TABLET | Freq: Every day | ORAL | 0 refills | Status: DC | PRN

## 2023-10-08 NOTE — Addendum Note (Signed)
Addended by: Sandford Craze on: 10/08/2023 03:40 PM   Modules accepted: Orders

## 2023-10-08 NOTE — Telephone Encounter (Signed)
Not sure if ok to refill

## 2023-10-09 NOTE — Progress Notes (Unsigned)
  [name] [address] [place] [address] Phone: [phone] Subjective:   INadine Counts, am serving as a scribe for Dr. Antoine Primas.  I'm seeing this patient by the request  of:  Sandford Craze, NP  CC: back and neck pain   [id number]  Cher Tressel is a 42 y.o. male coming in with complaint of back and neck pain. Lumbar strain seen on 03/21/2023. OMT on 01/21/2023. Patient states same per usual. No new concerns.  Medications patient has been prescribed:   Taking:         Reviewed prior external information including notes and imaging from previsou exam, outside providers and external EMR if available.   As well as notes that were available from care everywhere and other healthcare systems.  Past medical history, social, surgical and family history all reviewed in electronic medical record.  No pertanent information unless stated regarding to the chief complaint.   Past Medical History:  Diagnosis Date   Asthma    childhood asthma    No Known Allergies   Review of Systems:  No headache, visual changes, nausea, vomiting, diarrhea, constipation, dizziness, abdominal pain, skin rash, fevers, chills, night sweats, weight loss, swollen lymph nodes, body aches, joint swelling, chest pain, shortness of breath, mood changes. POSITIVE muscle aches  Objective  Blood pressure (!) 130/98, pulse 80, height 5\' 6"  (1.676 m), weight 197 lb (89.4 kg), SpO2 96%.   General: No apparent distress alert and oriented x3 mood and affect normal, dressed appropriately.  HEENT: Pupils equal, extraocular movements intact  Respiratory: Patient's speak in full sentences and does not appear short of breath  Cardiovascular: No lower extremity edema, non tender, no erythema  Gait MSK:  Back loss of range of motion noted.  Some tenderness to palpation noted.  Tightness with Pearlean Brownie right greater than left.  Tightness over the sacroiliac joint  Osteopathic  findings T8 extended rotated and side bent left L2 flexed rotated and side bent right Sacrum right on right    Assessment and Plan:  Lumbar strain Mild recurrent symptoms that seems to be more on the right side.  Responded extremely well to osteopathic manipulation.  Discussed posture and ergonomics, discussed which activities to do and which ones to avoid.  Patient will follow-up with me again in 3 to 4 months otherwise.    Nonallopathic problems  Decision today to treat with OMT was based on Physical Exam  After verbal consent patient was treated with HVLA, ME, FPR techniques in  thoracic, lumbar, and sacral  areas  Patient tolerated the procedure well with improvement in symptoms  Patient given exercises, stretches and lifestyle modifications  See medications in patient instructions if given  Patient will follow up in 4-8 weeks     The above documentation has been reviewed and is accurate and complete Judi Saa, DO         Note: This dictation was prepared with Dragon dictation along with smaller phrase technology. Any transcriptional errors that result from this process are unintentional.

## 2023-10-10 ENCOUNTER — Encounter: Payer: Self-pay | Admitting: Family Medicine

## 2023-10-10 ENCOUNTER — Ambulatory Visit: Payer: 59 | Admitting: Family Medicine

## 2023-10-10 VITALS — BP 130/98 | HR 80 | Ht 66.0 in | Wt 197.0 lb

## 2023-10-10 DIAGNOSIS — M9903 Segmental and somatic dysfunction of lumbar region: Secondary | ICD-10-CM | POA: Diagnosis not present

## 2023-10-10 DIAGNOSIS — S39012D Strain of muscle, fascia and tendon of lower back, subsequent encounter: Secondary | ICD-10-CM | POA: Diagnosis not present

## 2023-10-10 DIAGNOSIS — M9902 Segmental and somatic dysfunction of thoracic region: Secondary | ICD-10-CM | POA: Diagnosis not present

## 2023-10-10 DIAGNOSIS — M9904 Segmental and somatic dysfunction of sacral region: Secondary | ICD-10-CM | POA: Diagnosis not present

## 2023-10-10 NOTE — Assessment & Plan Note (Signed)
Mild recurrent symptoms that seems to be more on the right side.  Responded extremely well to osteopathic manipulation.  Discussed posture and ergonomics, discussed which activities to do and which ones to avoid.  Patient will follow-up with me again in 3 to 4 months otherwise.

## 2023-11-22 ENCOUNTER — Telehealth: Payer: Self-pay

## 2023-11-22 MED ORDER — MELOXICAM 7.5 MG PO TABS: 7.5000 mg | ORAL_TABLET | Freq: Every day | ORAL | 0 refills | Status: DC | PRN

## 2023-11-22 NOTE — Telephone Encounter (Signed)
Rx sent   Copied from CRM [id number]. Topic: Clinical - Medication Refill >> Nov 22, 2023  1:22 PM Fredrich Romans wrote: Most Recent Primary Care Visit:  Provider: O'SULLIVAN, MELISSA  Department: [facility]  Visit Type: PHYSICAL  Date: 05/27/2023  Medication: meloxicam (MOBIC) 7.5 MG tablet  Has the patient contacted their pharmacy? No (Agent: If no, request that the patient contact the pharmacy for the refill. If patient does not wish to contact the pharmacy document the reason why and proceed with request.) (Agent: If yes, when and what did the pharmacy advise?)  Is this the correct pharmacy for this prescription? Yes If no, delete pharmacy and type the correct one.  This is the patient's preferred pharmacy:  [id number] [place], [place] - [address] [facility] [address] Phone: [phone] Fax: [phone]  [facility] [address] [place] [address] Phone: [phone] Fax: [phone]   Has the prescription been filled recently? No  Is the patient out of the medication? Yes  Has the patient been seen for an appointment in the last year OR does the patient have an upcoming appointment? Yes  Can we respond through MyChart? Yes  Agent: Please be advised that Rx refills may take up to 3 business days. We ask that you follow-up with your pharmacy.

## 2023-12-29 ENCOUNTER — Other Ambulatory Visit: Payer: Self-pay | Admitting: Family

## 2023-12-30 ENCOUNTER — Ambulatory Visit: Payer: Self-pay | Admitting: Family

## 2023-12-30 MED ORDER — MELOXICAM 7.5 MG PO TABS: 7.5000 mg | ORAL_TABLET | Freq: Every day | ORAL | 0 refills | Status: DC | PRN

## 2023-12-30 NOTE — Telephone Encounter (Signed)
Copied from CRM [id number]. Topic: Clinical - Red Word Triage >> Dec 30, 2023 11:10 AM Gurney Maxin H wrote: [facility] that prompted transfer to Nurse Triage: Sciatic nerve pain, back pain, back is inflamed. Chief Complaint: back pain Symptoms: Low back, right leg, leg tingling 5/10 pain and inflammation in lef Frequency: Saturday Pertinent Negatives: Patient denies numbness Disposition: [] ED /[] Urgent Care (no appt availability in office) / [x] Appointment(In office/virtual)/ []  [facility]/ [] Home Care/ [] Refused Recommended Disposition /[x] [facility]/ []  Follow-up with PCP Additional Notes: patient does not want an appointment at present time: pt would ike to request refill for mobic and as possible a steroid, if needed to help with  Sciatic nerve related issues, tingling, pain and some inflammation. Reason for Disposition  Numbness in a leg or foot (i.e., loss of sensation)    Pt would like to request a prescription from PCP: nurse will route request to office and ask off to contact the pt back regarding this status  Answer Assessment - Initial Assessment Questions 1. ONSET: "When did the pain begin?"      Saturday 2. LOCATION: "Where does it hurt?" (upper, mid or lower back)     Low back, right leg, leg tingling 5/10 pain and inflammation in lef 3. SEVERITY: "How bad is the pain?"  (e.g., Scale 1-10; mild, moderate, or severe)   - MILD (1-3): Doesn't interfere with normal activities.    - MODERATE (4-7): Interferes with normal activities or awakens from sleep.    - SEVERE (8-10): Excruciating pain, unable to do any normal activities.      5/10 4. PATTERN: "Is the pain constant?" (e.g., yes, no; constant, intermittent)      constant 5. RADIATION: "Does the pain shoot into your legs or somewhere else?"     Non  radiates 6. CAUSE:  "What do you think is causing the back pain?"      Sciatic nerve related pinched nerve 7. BACK OVERUSE:  "Any recent lifting of heavy  objects, strenuous work or exercise?"     exercise 8. MEDICINES: "What have you taken so far for the pain?" (e.g., nothing, acetaminophen, NSAIDS)     No but mobic uses works plus steroid 9. NEUROLOGIC SYMPTOMS: "Do you have any weakness, numbness, or problems with bowel/bladder control?"     no 10. OTHER SYMPTOMS: "Do you have any other symptoms?" (e.g., fever, abdomen pain, burning with urination, blood in urine)       no 11. PREGNANCY: "Is there any chance you are pregnant?" "When was your last menstrual period?"       N/a  Protocols used: Back Pain-A-AH

## 2023-12-30 NOTE — Telephone Encounter (Signed)
Rx sent for meloxicam.  If symptoms do not improve, needs OV please.

## 2023-12-30 NOTE — Telephone Encounter (Signed)
Patient notified of this information.  He verbalized understanding

## 2023-12-31 ENCOUNTER — Ambulatory Visit: Payer: 59 | Admitting: Family

## 2024-01-06 ENCOUNTER — Emergency Department (HOSPITAL_BASED_OUTPATIENT_CLINIC_OR_DEPARTMENT_OTHER)
Admission: EM | Admit: 2024-01-06 | Discharge: 2024-01-06 | Disposition: A | Discharge status: Home / Self Care | Payer: 59 | Attending: Emergency Medicine | Admitting: Emergency Medicine

## 2024-01-06 ENCOUNTER — Other Ambulatory Visit: Payer: Self-pay

## 2024-01-06 ENCOUNTER — Encounter (HOSPITAL_BASED_OUTPATIENT_CLINIC_OR_DEPARTMENT_OTHER): Payer: Self-pay | Admitting: Emergency Medicine

## 2024-01-06 DIAGNOSIS — M545 Low back pain, unspecified: Secondary | ICD-10-CM | POA: Diagnosis present

## 2024-01-06 DIAGNOSIS — M5442 Lumbago with sciatica, left side: Secondary | ICD-10-CM | POA: Diagnosis not present

## 2024-01-06 DIAGNOSIS — M5432 Sciatica, left side: Secondary | ICD-10-CM

## 2024-01-06 MED ORDER — KETOROLAC TROMETHAMINE 60 MG/2ML IM SOLN
30.0000 mg | Freq: Once | INTRAMUSCULAR | Status: AC
  Administered 2024-01-06: 30 mg via INTRAMUSCULAR
  Filled 2024-01-06: qty 2

## 2024-01-06 MED ORDER — METHOCARBAMOL 500 MG PO TABS: 500.0000 mg | ORAL_TABLET | Freq: Two times a day (BID) | ORAL | 0 refills | Status: DC

## 2024-01-06 MED ORDER — METHOCARBAMOL 500 MG PO TABS
1000.0000 mg | ORAL_TABLET | Freq: Once | ORAL | Status: AC
  Administered 2024-01-06: 1000 mg via ORAL
  Filled 2024-01-06: qty 2

## 2024-01-06 MED ORDER — LIDOCAINE 5 % EX PTCH
3.0000 | MEDICATED_PATCH | CUTANEOUS | Status: DC
  Administered 2024-01-06: 3 via TRANSDERMAL
  Filled 2024-01-06: qty 3

## 2024-01-06 MED ORDER — LIDOCAINE 5 % EX PTCH: 1.0000 | MEDICATED_PATCH | CUTANEOUS | 0 refills | Status: DC

## 2024-01-06 MED ORDER — DEXAMETHASONE SODIUM PHOSPHATE 10 MG/ML IJ SOLN
10.0000 mg | Freq: Once | INTRAMUSCULAR | Status: AC
  Administered 2024-01-06: 10 mg via INTRAMUSCULAR
  Filled 2024-01-06: qty 1

## 2024-01-06 NOTE — ED Provider Notes (Signed)
 [name] Provider Note   CSN: [id number] Arrival date & time: 01/06/24  4098     History  Chief Complaint  Patient presents with   Leg Pain    [name] is a 43 y.o. male.  The history is provided by the patient.  Leg Pain Location:  Buttock and leg Time since incident:  1 week Buttock location:  L buttock Leg location:  L leg Pain details:    Quality:  Shooting   Radiates to:  L leg   Severity:  Severe   Timing:  Constant   Progression:  Unchanged Chronicity:  Recurrent Patient with sciatica presents with pain in the left buttock and LLE post going to the gym a week ago.  Started on Mobic  without relief.  Took norco prior to arrival.       Home Medications Prior to Admission medications   Medication Sig Start Date End Date Taking? Authorizing Provider  amLODipine  (NORVASC ) 2.5 MG tablet Take 1 tablet (2.5 mg total) by mouth daily. 06/27/23   O'Sullivan, Melissa, NP  betamethasone  valerate ointment (VALISONE ) 0.1 % Apply 1 Application topically daily. 07/08/23   O'Sullivan, Melissa, NP  meloxicam  (MOBIC ) 7.5 MG tablet Take 1 tablet (7.5 mg total) by mouth daily as needed. for pain 12/30/23   O'Sullivan, Melissa, NP      Allergies    Patient has no known allergies.    Review of Systems   Review of Systems  HENT:  Negative for facial swelling.   Respiratory:  Negative for wheezing and stridor.   Cardiovascular:  Negative for leg swelling.  All other systems reviewed and are negative.   Physical Exam Updated Vital Signs BP (!) 155/106 (BP Location: Right Arm)   Pulse (!) 49   Temp (!) 97.5 F (36.4 C) (Oral)   Resp 15   Ht 5\' 6"  (1.676 m)   Wt 84.4 kg   SpO2 99%   BMI 30.02 kg/m  Physical Exam Vitals and nursing note reviewed.  Constitutional:      General: He is not in acute distress.    Appearance: Normal appearance. He is well-developed. He is not diaphoretic.  HENT:     Head: Normocephalic and  atraumatic.     Nose: Nose normal.  Eyes:     Conjunctiva/sclera: Conjunctivae normal.     Pupils: Pupils are equal, round, and reactive to light.  Cardiovascular:     Rate and Rhythm: Normal rate and regular rhythm.     Pulses: Normal pulses.     Heart sounds: Normal heart sounds.  Pulmonary:     Effort: Pulmonary effort is normal.     Breath sounds: Normal breath sounds. No wheezing or rales.  Abdominal:     General: Bowel sounds are normal.     Palpations: Abdomen is soft.     Tenderness: There is no abdominal tenderness. There is no guarding or rebound.  Musculoskeletal:        General: Normal range of motion.     Cervical back: Normal range of motion and neck supple.  Skin:    General: Skin is warm and dry.     Capillary Refill: Capillary refill takes less than 2 seconds.  Neurological:     General: No focal deficit present.     Mental Status: He is alert and oriented to person, place, and time.     Deep Tendon Reflexes: Reflexes normal.  Psychiatric:  Mood and Affect: Mood normal.        Behavior: Behavior normal.     ED Results / Procedures / Treatments   Labs (all labs ordered are listed, but only abnormal results are displayed) Labs Reviewed - No data to display  EKG None  Radiology No results found.  Procedures Procedures    Medications Ordered in ED Medications  lidocaine  (LIDODERM ) 5 % 3 patch (3 patches Transdermal Patch Applied 01/06/24 0310)  ketorolac  (TORADOL ) injection 30 mg (has no administration in time range)  dexamethasone  (DECADRON ) injection 10 mg (10 mg Intramuscular Given 01/06/24 0311)  methocarbamol  (ROBAXIN ) tablet 1,000 mg (1,000 mg Oral Given 01/06/24 4098)    ED Course/ Medical Decision Making/ A&P                                 Medical Decision Making Patient with one week of sciatica.  No deficits   Amount and/or Complexity of Data Reviewed External Data Reviewed: notes.    Details: Previous notes reviewed    Risk Prescription drug management. Risk Details: Well appearing.  Neurovascularly intact.  Stable for discharge with close follow up.  Will add robaxin  and lidoderm  to home mobic  and norco.      Final Clinical Impression(s) / ED Diagnoses Final diagnoses:  None   I have reviewed the triage vital signs and the nursing notes. Pertinent labs & imaging results that were available during my care of the patient were reviewed by me and considered in my medical decision making (see chart for details). After history, exam, and medical workup I feel the patient has been appropriately medically screened and is safe for discharge home. Pertinent diagnoses were discussed with the patient. Patient was given return precautions.  Rx / DC Orders ED Discharge Orders     None         Zalia Hautala, MD 01/06/24 [phone]

## 2024-01-06 NOTE — ED Triage Notes (Signed)
 Pt reports L sided pain that starts at his buttock and radiates down. He states he thinks it is from working out. Pain started ~7-8 days ago. He states he has taken Meloxicam  and Hydrocodone with little to no relief. Pt states "last time I came here for this a few years ago they had to give me 2 narcotic shots and some steroids." Pt ambulatory to room without assistance. No other complaints.

## 2024-01-06 NOTE — ED Provider Notes (Addendum)
 Patient asked for IM dilaudid  for pain, by name.  There were no non-verbal cues of pain on my exam.  Vitals do not demonstrate indicators of pain like tachycardia.  Patient has known HTN  I have reviewed previous chart from 2022.  Patient was given Dilaudid  but had a driver but did not receive steroids.    Unless part of a hospital protocol, I do not use Dilaudid  as a part of my practice due to the high risk of addiction.  Moreover, the patient does not have a driver and has already taken opioids this evening that did not help.  Opioids do not lead mitigation of sciatica. Steroids and muscle relaxants are the main stay of treatment.      Patient then wanted to know if another ED would give him Dilaudid .  This is consistent with drug seeking.   Medications  lidocaine  (LIDODERM ) 5 % 3 patch (3 patches Transdermal Patch Applied 01/06/24 0310)  ketorolac  (TORADOL ) injection 30 mg (has no administration in time range)  dexamethasone  (DECADRON ) injection 10 mg (10 mg Intramuscular Given 01/06/24 0311)  methocarbamol  (ROBAXIN ) tablet 1,000 mg (1,000 mg Oral Given 01/06/24 0865)         Tarnisha Kachmar, MD 01/06/24 7846

## 2024-01-07 ENCOUNTER — Other Ambulatory Visit: Payer: Self-pay

## 2024-01-07 ENCOUNTER — Encounter (HOSPITAL_BASED_OUTPATIENT_CLINIC_OR_DEPARTMENT_OTHER): Payer: Self-pay

## 2024-01-07 ENCOUNTER — Emergency Department (HOSPITAL_BASED_OUTPATIENT_CLINIC_OR_DEPARTMENT_OTHER)
Admission: EM | Admit: 2024-01-07 | Discharge: 2024-01-07 | Disposition: A | Discharge status: Home / Self Care | Payer: 59 | Attending: Emergency Medicine | Admitting: Emergency Medicine

## 2024-01-07 DIAGNOSIS — M5442 Lumbago with sciatica, left side: Secondary | ICD-10-CM | POA: Diagnosis not present

## 2024-01-07 DIAGNOSIS — J45909 Unspecified asthma, uncomplicated: Secondary | ICD-10-CM | POA: Insufficient documentation

## 2024-01-07 DIAGNOSIS — R030 Elevated blood-pressure reading, without diagnosis of hypertension: Secondary | ICD-10-CM | POA: Insufficient documentation

## 2024-01-07 DIAGNOSIS — M545 Low back pain, unspecified: Secondary | ICD-10-CM | POA: Diagnosis present

## 2024-01-07 MED ORDER — TIZANIDINE HCL 2 MG PO TABS
4.0000 mg | ORAL_TABLET | Freq: Once | ORAL | Status: AC
  Administered 2024-01-07: 4 mg via ORAL
  Filled 2024-01-07: qty 2

## 2024-01-07 MED ORDER — PREDNISONE 50 MG PO TABS
60.0000 mg | ORAL_TABLET | Freq: Once | ORAL | Status: AC
  Administered 2024-01-07: 60 mg via ORAL
  Filled 2024-01-07: qty 1

## 2024-01-07 MED ORDER — OXYCODONE-ACETAMINOPHEN 5-325 MG PO TABS
1.0000 | ORAL_TABLET | ORAL | 0 refills | Status: DC | PRN
Start: 2024-01-07 — End: 2024-01-11

## 2024-01-07 MED ORDER — OXYCODONE-ACETAMINOPHEN 5-325 MG PO TABS
1.0000 | ORAL_TABLET | Freq: Once | ORAL | Status: AC
  Administered 2024-01-07: 1 via ORAL
  Filled 2024-01-07: qty 1

## 2024-01-07 MED ORDER — PREDNISONE 50 MG PO TABS: 50.0000 mg | ORAL_TABLET | Freq: Every day | ORAL | 0 refills | Status: DC

## 2024-01-07 MED ORDER — TIZANIDINE HCL 4 MG PO TABS: 4.0000 mg | ORAL_TABLET | Freq: Four times a day (QID) | ORAL | 0 refills | Status: DC | PRN

## 2024-01-07 NOTE — ED Notes (Signed)
ED Provider at bedside.

## 2024-01-07 NOTE — ED Provider Notes (Signed)
[place] [facility] DE[facility] AT [facility] Provider Note   CSN: [id number] Arrival date & time: 01/07/24  0316     History  Chief Complaint  Patient presents with   Back Pain    [name] is a 43 y.o. male.  The history is provided by the patient.  Back Pain He has history of asthma and comes in because of left lumbar pain radiating down his left leg.  He has a history of similar pain in the past diagnosed as sciatica.  This episode started about 2 weeks ago following a workout at the gym.  He was seen at [facility] emergency department yesterday and discharged with prescriptions for lidocaine patch and methocarbamol which have not been helping.  He has been taking meloxicam without relief.  He denies weakness, numbness, tingling.  He denies any bowel or bladder dysfunction.  He does have an appointment on 2/13 to see his sports medicine physician.  He states that physician usually does a manipulation which gives him relief.   Home Medications Prior to Admission medications   Medication Sig Start Date End Date Taking? Authorizing Provider  amLODipine (NORVASC) 2.5 MG tablet Take 1 tablet (2.5 mg total) by mouth daily. 06/27/23   Sandford Craze, NP  betamethasone valerate ointment (VALISONE) 0.1 % Apply 1 Application topically daily. 07/08/23   Sandford Craze, NP  lidocaine (LIDODERM) 5 % Place 1 patch onto the skin daily. Remove & Discard patch within 12 hours or as directed by MD 01/06/24   Nicanor Alcon, April, MD  meloxicam (MOBIC) 7.5 MG tablet Take 1 tablet (7.5 mg total) by mouth daily as needed. for pain 12/30/23   Sandford Craze, NP  methocarbamol (ROBAXIN) 500 MG tablet Take 1 tablet (500 mg total) by mouth 2 (two) times daily. 01/06/24   Palumbo, April, MD      Allergies    Patient has no known allergies.    Review of Systems   Review of Systems  Musculoskeletal:  Positive for back pain.  All other systems reviewed and are  negative.   Physical Exam Updated Vital Signs BP (!) 165/117   Pulse 74   Temp 98.3 F (36.8 C) (Oral)   Resp 18   Ht 5\' 6"  (1.676 m)   Wt 84.4 kg   SpO2 99%   BMI 30.02 kg/m  Physical Exam Vitals and nursing note reviewed.   43 year old male, resting comfortably and in no acute distress. Vital signs are significant for elevated blood pressure. Oxygen saturation is 99%, which is normal. Head is normocephalic and atraumatic. PERRLA, EOMI.  Neck is nontender and supple. Back is nontender in the midline.  There is mild to moderate left paralumbar spasm.  Straight leg raise is positive on the right at 45 degrees, left at 30 degrees. Lungs are clear without rales, wheezes, or rhonchi. Chest is nontender. Heart has regular rate and rhythm without murmur. Abdomen is soft, flat, nontender. Extremities have no cyanosis or edema, full range of motion is present. Skin is warm and dry without rash. Neurologic: Mental status is normal, cranial nerves are intact, strength is 5/5 in all 4 extremities, no sensory deficits.  ED Results / Procedures / Treatments    Procedures Procedures    Medications Ordered in ED Medications  oxyCODONE-acetaminophen (PERCOCET/ROXICET) 5-325 MG per tablet 1 tablet (1 tablet Oral Given 01/07/24 0404)  tiZANidine (ZANAFLEX) tablet 4 mg (4 mg Oral Given 01/07/24 0405)  predniSONE (DELTASONE) tablet 60 mg (60 mg  Oral Given 01/07/24 0405)    ED Course/ Medical Decision Making/ A&P                                 Medical Decision Making  Left lumbar pain with left-sided sciatica.  I have reviewed his past records, and he has a prior ED visit on 01/06/2024 and also on 05/05/2021 for left-sided sciatica, also office visits on 10/10/2023, 03/21/2023, 01/21/2023 for sciatica treated with osteopathic manipulation.  No indication for imaging today.  I have ordered a dose of prednisone, oxycodone-acetaminophen, tizanidine and I am discharging him with prescriptions for  each of those.  Discontinue methocarbamol since it has not been effective.  He has an appointment in 2 days with sports medicine and is to keep that appointment.  Final Clinical Impression(s) / ED Diagnoses Final diagnoses:  Acute left-sided low back pain with left-sided sciatica  Elevated blood pressure reading without diagnosis of hypertension    Rx / DC Orders ED Discharge Orders          Ordered    tiZANidine (ZANAFLEX) 4 MG tablet  Every 6 hours PRN        01/07/24 0404    predniSONE (DELTASONE) 50 MG tablet  Daily        01/07/24 0404    oxyCODONE-acetaminophen (PERCOCET) 5-325 MG tablet  Every 4 hours PRN        01/07/24 0404              Dione Booze, MD 01/07/24 0413

## 2024-01-07 NOTE — ED Triage Notes (Signed)
Pt POV from home d/t left sided sciatic pain - has hx of same on right side."Feels same". Pt was seen in [facility] last night but only given a muscle relaxer and the time previus was given IM shots of Narcotics but they would not give last night.

## 2024-01-07 NOTE — Discharge Instructions (Addendum)
Apply ice for 30 minutes at a time, 4 times a day.  Continue taking meloxicam.  Follow-up with your sports medicine physician.

## 2024-01-08 NOTE — Progress Notes (Unsigned)
[name] [address] [place] [address] Phone: [phone] Subjective:   Maxwell [name], am serving as a scribe for Dr. Antoine Primas.  I'm seeing this patient by the request  of:  Sandford Craze, NP  CC: back pain follow up   [id number]  10/10/2023 Mild recurrent symptoms that seems to be more on the right side.  Responded extremely well to osteopathic manipulation.  Discussed posture and ergonomics, discussed which activities to do and which ones to avoid.  Patient will follow-up with me again in 3 to 4 months otherwise.     Updated 01/09/2024 Maxwell [name] is a 43 y.o. male coming in with complaint of back pain. Patient states he pulled his back and he had to go into ED. Pain radiates down the front of L leg to his foot. Pain is constant and is intense.   Patient was seen in the emergency room yesterday.  Patient was given prednisone and oxycodone.  Also given Zanaflex.  Shift   Last x-rays in 2022 showed the patient did have degenerative disc disease at L5-S1.  Past Medical History:  Diagnosis Date   Asthma    childhood asthma   No past surgical history on file. Social History   Socioeconomic History   Marital status: Single    Spouse name: Not on file   Number of children: Not on file   Years of education: Not on file   Highest education level: Master's degree (e.g., MA, MS, MEng, MEd, MSW, MBA)  Occupational History   Not on file  Tobacco Use   Smoking status: Never   Smokeless tobacco: Never  Vaping Use   Vaping status: Never Used  Substance and Sexual Activity   Alcohol use: No   Drug use: Never   Sexual activity: Yes    Partners: Female  Other Topics Concern   Not on file  Social History Narrative   Married   No children   Biochemist, clinical degree   Enjoys basketball   Grew up in [place].   Social Drivers of Corporate investment banker Strain: Low Risk  (03/28/2023)   Overall Financial  Resource Strain (CARDIA)    Difficulty of Paying Living Expenses: Not hard at all  Food Insecurity: No Food Insecurity (03/28/2023)   Hunger Vital Sign    Worried About Running Out of Food in the Last Year: Never true    Ran Out of Food in the Last Year: Never true  Transportation Needs: No Transportation Needs (03/28/2023)   PRAPARE - Administrator, Civil Service (Medical): No    Lack of Transportation (Non-Medical): No  Physical Activity: Sufficiently Active (03/28/2023)   Exercise Vital Sign    Days of Exercise per Week: 5 days    Minutes of Exercise per Session: 30 min  Stress: No Stress Concern Present (03/28/2023)   [employer] of Occupational Health - Occupational Stress Questionnaire    Feeling of Stress : Not at all  Social Connections: Moderately Integrated (03/28/2023)   Social Connection and Isolation Panel [NHANES]    Frequency of Communication with Friends and Family: More than three times a week    Frequency of Social Gatherings with Friends and Family: Once a week    Attends Religious Services: More than 4 times per year    Active Member of [employer] or Organizations: No    Attends Banker Meetings: Not on file    Marital  Status: Married   No Known Allergies Family History  Problem Relation Age of Onset   Hypertension Mother    Hypertension Father    Liver disease Sister    Cancer Maternal Uncle        prostate   Diabetes Half-Sister    Heart disease Neg Hx    Kidney disease Neg Hx     Current Outpatient Medications (Endocrine & Metabolic):    predniSONE (DELTASONE) 50 MG tablet, Take 1 tablet (50 mg total) by mouth daily.  Current Outpatient Medications (Cardiovascular):    amLODipine (NORVASC) 2.5 MG tablet, Take 1 tablet (2.5 mg total) by mouth daily.   Current Outpatient Medications (Analgesics):    meloxicam (MOBIC) 7.5 MG tablet, Take 1 tablet (7.5 mg total) by mouth daily as needed. for pain   oxyCODONE-acetaminophen (PERCOCET)  5-325 MG tablet, Take 1 tablet by mouth every 4 (four) hours as needed for moderate pain (pain score 4-6).   Current Outpatient Medications (Other):    betamethasone valerate ointment (VALISONE) 0.1 %, Apply 1 Application topically daily.   lidocaine (LIDODERM) 5 %, Place 1 patch onto the skin daily. Remove & Discard patch within 12 hours or as directed by MD   tiZANidine (ZANAFLEX) 4 MG tablet, Take 1 tablet (4 mg total) by mouth every 6 (six) hours as needed for muscle spasms.   Reviewed prior external information including notes and imaging from  primary care provider As well as notes that were available from care everywhere and other healthcare systems.  Past medical history, social, surgical and family history all reviewed in electronic medical record.  No pertanent information unless stated regarding to the chief complaint.   Review of Systems:  No headache, visual changes, nausea, vomiting, diarrhea, constipation, dizziness, abdominal pain, skin rash, fevers, chills, night sweats, weight loss, swollen lymph nodes, body aches, joint swelling, chest pain, shortness of breath, mood changes. POSITIVE muscle aches  Objective  Blood pressure (!) 132/94, pulse 72, height 5\' 6"  (1.676 m), weight 186 lb (84.4 kg), SpO2 98%.   General: No apparent distress alert and oriented x3 mood and affect normal, dressed appropriately.  HEENT: Pupils equal, extraocular movements intact  Respiratory: Patient's speak in full sentences and does not appear short of breath  Cardiovascular: No lower extremity edema, non tender, no erythema   Low back exam does have some loss lordosis noted.  Some tenderness to palpation in the paraspinal musculature.  Patient's course seems to be improved.  Does have more tightness noted on the lateral left side than the right side with straight leg test.  Osteopathic findings C2 flexed rotated and side bent right C4 flexed rotated and side bent left C7 flexed rotated and  side bent left T3 extended rotated and side bent right inhaled third rib T9 extended rotated and side bent left L1 flexed rotated and side bent right L5 flexed rotated and side bent left Sacrum right on right    Impression and Recommendations:    The above documentation has been reviewed and is accurate and complete Judi Saa, DO

## 2024-01-09 ENCOUNTER — Ambulatory Visit: Payer: 59 | Admitting: Family Medicine

## 2024-01-09 ENCOUNTER — Encounter: Payer: Self-pay | Admitting: Family Medicine

## 2024-01-09 VITALS — BP 132/94 | HR 72 | Ht 66.0 in | Wt 186.0 lb

## 2024-01-09 DIAGNOSIS — M9901 Segmental and somatic dysfunction of cervical region: Secondary | ICD-10-CM | POA: Diagnosis not present

## 2024-01-09 DIAGNOSIS — M9904 Segmental and somatic dysfunction of sacral region: Secondary | ICD-10-CM

## 2024-01-09 DIAGNOSIS — M51362 Other intervertebral disc degeneration, lumbar region with discogenic back pain and lower extremity pain: Secondary | ICD-10-CM | POA: Diagnosis not present

## 2024-01-09 DIAGNOSIS — M9902 Segmental and somatic dysfunction of thoracic region: Secondary | ICD-10-CM | POA: Diagnosis not present

## 2024-01-09 DIAGNOSIS — M9903 Segmental and somatic dysfunction of lumbar region: Secondary | ICD-10-CM | POA: Diagnosis not present

## 2024-01-09 DIAGNOSIS — M9908 Segmental and somatic dysfunction of rib cage: Secondary | ICD-10-CM

## 2024-01-09 DIAGNOSIS — M51369 Other intervertebral disc degeneration, lumbar region without mention of lumbar back pain or lower extremity pain: Secondary | ICD-10-CM | POA: Insufficient documentation

## 2024-01-09 NOTE — Assessment & Plan Note (Addendum)
Simaan radicular symptoms on the left leg patient was having.  Responded extremely well to osteopathic manipulation.  Discussed icing regimen and home exercises.  Patient was given prednisone by the emergency room provider and can take it if needed but I do believe the patient is going to do fine and be ready to workout again next week.  Will follow-up with me again in 6 weeks as we stated increase activity slowly.

## 2024-01-09 NOTE — Patient Instructions (Signed)
Take prednisone  See me in 6 weeks

## 2024-01-10 ENCOUNTER — Ambulatory Visit: Payer: Self-pay | Admitting: Family

## 2024-01-10 NOTE — Telephone Encounter (Signed)
Copied from CRM [id number]. Topic: Clinical - Medication Refill >> Jan 10, 2024  8:20 AM Aletta Edouard wrote: Most Recent Primary Care Visit:  Provider: O'SULLIVAN, MELISSA  Department: [facility]  Visit Type: PHYSICAL  Date: 05/27/2023  Medication: oxyCODONE-acetaminophen (PERCOCET) 5-325 MG tablet  predniSONE (DELTASONE) 50 MG tablet   Has the patient contacted their pharmacy? Yes (Agent: If no, request that the patient contact the pharmacy for the refill. If patient does not wish to contact the pharmacy document the reason why and proceed with request.) (Agent: If yes, when and what did the pharmacy advise?)  Is this the correct pharmacy for this prescription? Yes If no, delete pharmacy and type the correct one.  This is the patient's preferred pharmacy:  [id number] [place], [place] - [address] [place] [address] Phone: [phone] Fax: [phone]   Has the prescription been filled recently? No  Is the patient out of the medication? Yes  Has the patient been seen for an appointment in the last year OR does the patient have an upcoming appointment? Yes  Can we respond through MyChart? Yes  Agent: Please be advised that Rx refills may take up to 3 business days. We ask that you follow-up with your pharmacy.     Chief Complaint: Lower back pain Symptoms: pain Frequency: since recent visit Pertinent Negatives: Patient denies bowel/bladder issues, chest pain, difficulty breathing Disposition: [] ED /[] Urgent Care (no appt availability in office) / [] Appointment(In office/virtual)/ []  [facility]/ [] Home Care/ [] Refused Recommended Disposition /[] [facility]/ []  Follow-up with PCP Additional Notes: Patient called to see if he could get a refill of these two medications: oxyCODONE-acetaminophen (PERCOCET) 5-325 MG tablet  predniSONE (DELTASONE) 50 MG tablet  Patient states that he is still having a lot of pain. Patient  states that he called the ER and they told him that they wouldn't give him anything stronger so he didn't want to go back to the ER.  He was just seen in the office yesterday so he wanted to see if he could possibly get a refill of this.  Patient is denying any changes, bowel/bladder concerns/chest pain/or difficulty breathing. He is also advised that if anything gets worse to go to the emergency room.  Patient verbalized understanding.   Answer Assessment - Initial Assessment Questions 1. REASON FOR CALL or QUESTION: "What is your reason for calling today?" or "How can I best help you?" or "What question do you have that I can help answer?"     Patient called to see if he could get a refill of these two medications: oxyCODONE-acetaminophen (PERCOCET) 5-325 MG tablet  predniSONE (DELTASONE) 50 MG tablet   Patient states that he is still having a lot of pain. Patient states that he called the ER and they told him that they wouldn't give him anything stronger so he didn't want to go back to the ER.  He was just seen in the office yesterday so he wanted to see if he could possibly get a refill of this.  Protocols used: Information Only Call - No Triage-A-AH

## 2024-01-11 ENCOUNTER — Other Ambulatory Visit: Payer: Self-pay

## 2024-01-11 ENCOUNTER — Encounter (HOSPITAL_BASED_OUTPATIENT_CLINIC_OR_DEPARTMENT_OTHER): Payer: Self-pay

## 2024-01-11 ENCOUNTER — Emergency Department (HOSPITAL_BASED_OUTPATIENT_CLINIC_OR_DEPARTMENT_OTHER)
Admission: EM | Admit: 2024-01-11 | Discharge: 2024-01-11 | Disposition: A | Discharge status: Home / Self Care | Payer: 59 | Attending: Emergency Medicine | Admitting: Emergency Medicine

## 2024-01-11 DIAGNOSIS — M5432 Sciatica, left side: Secondary | ICD-10-CM | POA: Diagnosis present

## 2024-01-11 MED ORDER — OXYCODONE-ACETAMINOPHEN 5-325 MG PO TABS: 1.0000 | ORAL_TABLET | Freq: Three times a day (TID) | ORAL | 0 refills | Status: DC | PRN

## 2024-01-11 MED ORDER — METHYLPREDNISOLONE SODIUM SUCC 125 MG IJ SOLR
125.0000 mg | Freq: Once | INTRAMUSCULAR | Status: AC
  Administered 2024-01-11: 125 mg via INTRAMUSCULAR
  Filled 2024-01-11: qty 2

## 2024-01-11 MED ORDER — PREDNISONE 20 MG PO TABS: ORAL_TABLET | ORAL | 0 refills | Status: DC

## 2024-01-11 MED ORDER — GABAPENTIN 100 MG PO CAPS: 100.0000 mg | ORAL_CAPSULE | Freq: Three times a day (TID) | ORAL | 0 refills | Status: DC

## 2024-01-11 MED ORDER — HYDROMORPHONE HCL 1 MG/ML IJ SOLN
1.0000 mg | Freq: Once | INTRAMUSCULAR | Status: AC
  Administered 2024-01-11: 1 mg via INTRAMUSCULAR
  Filled 2024-01-11: qty 1

## 2024-01-11 NOTE — ED Provider Notes (Signed)
[place] EMERGENCY DEPARTMENT AT [facility] Provider Note   CSN: [id number] Arrival date & time: 01/11/24  0417     History  Chief Complaint  Patient presents with   Sciatica    Maxwell [name] is a 43 y.o. male.  43 year old male with history of sciatica that presents ER today with 5 days of exacerbation of the same pain.  Patient is he diagnosed 2022.  Has had intermittent episodes since that time but none quite as bad as this 1.  Been seen in the ED twice for this and also saw his primary doctor and a sports medicine doctor and he just continues to be present even with Robaxin, Zanaflex, oxycodone, steroids and all other treatments he is received.  No incontinence, weakness, numbness, tingling or other associated symptoms.        Home Medications Prior to Admission medications   Medication Sig Start Date End Date Taking? Authorizing Provider  gabapentin (NEURONTIN) 100 MG capsule Take 1 capsule (100 mg total) by mouth 3 (three) times daily. 01/11/24  Yes Kimbria Camposano, Barbara Cower, MD  predniSONE (DELTASONE) 20 MG tablet 3 tabs po daily x 3 days, then 2 tabs x 3 days, then 1.5 tabs x 3 days, then 1 tab x 3 days, then 0.5 tabs x 3 days 01/11/24  Yes Shimshon Narula, Barbara Cower, MD  amLODipine (NORVASC) 2.5 MG tablet Take 1 tablet (2.5 mg total) by mouth daily. 06/27/23   Sandford Craze, NP  betamethasone valerate ointment (VALISONE) 0.1 % Apply 1 Application topically daily. 07/08/23   Sandford Craze, NP  lidocaine (LIDODERM) 5 % Place 1 patch onto the skin daily. Remove & Discard patch within 12 hours or as directed by MD 01/06/24   Nicanor Alcon, April, MD  meloxicam (MOBIC) 7.5 MG tablet Take 1 tablet (7.5 mg total) by mouth daily as needed. for pain 12/30/23   Sandford Craze, NP  oxyCODONE-acetaminophen (PERCOCET) 5-325 MG tablet Take 1 tablet by mouth every 8 (eight) hours as needed. 01/11/24   Keaten Mashek, Barbara Cower, MD  tiZANidine (ZANAFLEX) 4 MG tablet Take 1 tablet (4 mg total) by mouth every 6 (six)  hours as needed for muscle spasms. 01/07/24   Dione Booze, MD      Allergies    Patient has no known allergies.    Review of Systems   Review of Systems  Physical Exam Updated Vital Signs BP (!) 164/112 (BP Location: Right Arm)   Pulse 70   Temp 98.3 F (36.8 C) (Oral)   Resp 18   Ht 5\' 6"  (1.676 m)   Wt 84.4 kg   SpO2 100%   BMI 30.02 kg/m  Physical Exam Vitals and nursing note reviewed.  Constitutional:      Appearance: He is well-developed.  HENT:     Head: Normocephalic and atraumatic.  Cardiovascular:     Rate and Rhythm: Normal rate.  Pulmonary:     Effort: Pulmonary effort is normal. No respiratory distress.  Abdominal:     General: There is no distension.  Musculoskeletal:        General: Normal range of motion.     Cervical back: Normal range of motion.  Neurological:     Mental Status: He is alert.     Comments: No altered mental status, able to give full seemingly accurate history.  Face is symmetric, EOM's intact, pupils equal and reactive, vision intact, tongue and uvula midline without deviation. Upper and Lower extremity motor 5/5, intact pain perception in distal extremities, 2+ reflexes in biceps,  patella and achilles tendons. Able to perform finger to nose normal with both hands. Walks without assistance or evident ataxia.       ED Results / Procedures / Treatments   Labs (all labs ordered are listed, but only abnormal results are displayed) Labs Reviewed - No data to display  EKG None  Radiology No results found.  Procedures Procedures    Medications Ordered in ED Medications  HYDROmorphone (DILAUDID) injection 1 mg (1 mg Intramuscular Given 01/11/24 0541)  methylPREDNISolone sodium succinate (SOLU-MEDROL) 125 mg/2 mL injection 125 mg (125 mg Intramuscular Given 01/11/24 0541)    ED Course/ Medical Decision Making/ A&P                                 Medical Decision Making Risk Prescription drug management.   Acute  exacerbation of sciatica most likely.  I do not know that he is an appropriate course of steroids so we will restart those.  Will also try Neurontin as it seems to help in the past.  As this is his third visit here and fifth visit to a healthcare provider for the same symptoms we will going give him Dilaudid as well.  Final Clinical Impression(s) / ED Diagnoses Final diagnoses:  Sciatica of left side    Rx / DC Orders ED Discharge Orders          Ordered    gabapentin (NEURONTIN) 100 MG capsule  3 times daily        01/11/24 0547    predniSONE (DELTASONE) 20 MG tablet        01/11/24 0547    oxyCODONE-acetaminophen (PERCOCET) 5-325 MG tablet  Every 8 hours PRN,   Status:  Discontinued        01/11/24 0547    oxyCODONE-acetaminophen (PERCOCET) 5-325 MG tablet  Every 8 hours PRN        01/11/24 0548              Annya Lizana, Barbara Cower, MD 01/11/24 [id number]

## 2024-01-11 NOTE — ED Triage Notes (Signed)
Pt POV d/t left sided sciatica.  Pt has been seen for this before but not going away.  Pt states  he knows what works but has not been able to get.

## 2024-01-21 ENCOUNTER — Other Ambulatory Visit: Payer: Self-pay

## 2024-01-22 ENCOUNTER — Other Ambulatory Visit: Payer: Self-pay | Admitting: Family

## 2024-01-22 NOTE — Telephone Encounter (Signed)
 Copied from CRM [id number]. Topic: Clinical - Medication Refill >> Jan 22, 2024  9:07 AM Randa Ngo wrote: Most Recent Primary Care Visit:  Provider: O'SULLIVAN, MELISSA  Department: [facility]  Visit Type: PHYSICAL  Date: 05/27/2023  Medication: oxyCODONE-acetaminophen (PERCOCET) 5-325 MG tablet   Has the patient contacted their pharmacy? No (Agent: If no, request that the patient contact the pharmacy for the refill. If patient does not wish to contact the pharmacy document the reason why and proceed with request.) (Agent: If yes, when and what did the pharmacy advise?)  Is this the correct pharmacy for this prescription? Yes If no, delete pharmacy and type the correct one.  This is the patient's preferred pharmacy:  [id number] [place], [place] - [address] [place] [address] Phone: [phone] Fax: [phone]   Has the prescription been filled recently? No  Is the patient out of the medication? Yes  Has the patient been seen for an appointment in the last year OR does the patient have an upcoming appointment? Yes  Can we respond through MyChart? Yes  Agent: Please be advised that Rx refills may take up to 3 business days. We ask that you follow-up with your pharmacy.

## 2024-01-24 ENCOUNTER — Other Ambulatory Visit: Payer: Self-pay

## 2024-01-24 ENCOUNTER — Ambulatory Visit: Payer: Self-pay

## 2024-01-24 NOTE — Telephone Encounter (Addendum)
 Copied from CRM [id number]. Topic: Clinical - Red Word Triage >> Jan 24, 2024 10:55 AM Deaijah H wrote: Red Word that prompted transfer to Nurse Triage: Extreme pain sciatica pinch nerve   Call was transferred to this RN. Attempted to ask about patient's symptoms, and patient did not want to discuss symptoms. He immediately stated that he does not wish to make an appointment. He stated all the information is already in his chart. He stated that he only called for a refill of Oxycodone. Attempted to get further details from patient regarding the prescription. He ended the call and stated he will call the office back.

## 2024-01-24 NOTE — Telephone Encounter (Signed)
 You are no longer listed as PCP, is this still your patient?

## 2024-01-24 NOTE — Telephone Encounter (Signed)
 Copied from CRM [id number]. Topic: Clinical - Prescription Issue >> Jan 24, 2024  9:25 AM Theodis Sato wrote: Reason for CRM: Patient is requesting an update on his re-fill for oxyCODONE-acetaminophen (PERCOCET) 5-325 MG tablet - advised patient it has not been a full 3 business and but he insisted another message be sent as he is out of his medication.

## 2024-01-24 NOTE — Telephone Encounter (Signed)
 Please advise pt that I can't refill his oxycodone without a face to face visit.

## 2024-02-03 ENCOUNTER — Other Ambulatory Visit: Payer: Self-pay | Admitting: Family

## 2024-02-03 NOTE — Telephone Encounter (Signed)
 Copied from CRM [id number]. Topic: Clinical - Medication Refill >> Feb 03, 2024  8:38 AM Lennart Pall wrote: Most Recent Primary Care Visit:  Provider: O'SULLIVAN, MELISSA  Department: [facility]  Visit Type: PHYSICAL  Date: 05/27/2023  Medication: gabapentin (NEURONTIN) 100 MG capsule  Has the patient contacted their pharmacy? Yes (Agent: If no, request that the patient contact the pharmacy for the refill. If patient does not wish to contact the pharmacy document the reason why and proceed with request.) (Agent: If yes, when and what did the pharmacy advise?)  Is this the correct pharmacy for this prescription? Yes If no, delete pharmacy and type the correct one.  This is the patient's preferred pharmacy:  [id number] [place], [place] - [address] [place] [address] Phone: [phone] Fax: [phone]    Has the prescription been filled recently? Yes  Is the patient out of the medication? Yes  Has the patient been seen for an appointment in the last year OR does the patient have an upcoming appointment? Yes  Can we respond through MyChart? Yes  Agent: Please be advised that Rx refills may take up to 3 business days. We ask that you follow-up with your pharmacy.

## 2024-02-03 NOTE — Telephone Encounter (Signed)
 Pt has not established care w/ Dr. Laury Axon yet.

## 2024-02-03 NOTE — Telephone Encounter (Signed)
 Copied from CRM [id number]. Topic: Clinical - Medication Refill >> Feb 03, 2024  9:31 AM Florestine Avers wrote: Most Recent Primary Care Visit:  Provider: O'SULLIVAN, MELISSA  Department: [facility]  Visit Type: PHYSICAL  Date: 05/27/2023  Medication: gabapentin (NEURONTIN) 100 MG capsule  Has the patient contacted their pharmacy? No (Agent: If no, request that the patient contact the pharmacy for the refill. If patient does not wish to contact the pharmacy document the reason why and proceed with request.) (Agent: If yes, when and what did the pharmacy advise?)  Is this the correct pharmacy for this prescription? Yes If no, delete pharmacy and type the correct one.  This is the patient's preferred pharmacy:  [id number] [place], [place] - [address] [facility] [address] Phone: [phone] Fax: [phone]  [facility] [address] [place] [address] Phone: [phone] Fax: [phone]   Has the prescription been filled recently? No  Is the patient out of the medication? Yes  Has the patient been seen for an appointment in the last year OR does the patient have an upcoming appointment? Yes  Can we respond through MyChart? Yes  Agent: Please be advised that Rx refills may take up to 3 business days. We ask that you follow-up with your pharmacy.

## 2024-02-03 NOTE — Telephone Encounter (Signed)
 [facility] He was advised the provider will do a follow up assessment and determine best treatment.

## 2024-02-04 ENCOUNTER — Ambulatory Visit: Admitting: Family Medicine

## 2024-02-04 VITALS — BP 130/80 | HR 69 | Temp 98.0°F | Resp 18 | Ht 66.0 in | Wt 189.6 lb

## 2024-02-04 DIAGNOSIS — G8929 Other chronic pain: Secondary | ICD-10-CM

## 2024-02-04 DIAGNOSIS — R52 Pain, unspecified: Secondary | ICD-10-CM

## 2024-02-04 DIAGNOSIS — M5442 Lumbago with sciatica, left side: Secondary | ICD-10-CM | POA: Diagnosis not present

## 2024-02-04 MED ORDER — GABAPENTIN 100 MG PO CAPS: 100.0000 mg | ORAL_CAPSULE | Freq: Three times a day (TID) | ORAL | 0 refills | Status: DC

## 2024-02-04 MED ORDER — OXYCODONE-ACETAMINOPHEN 5-325 MG PO TABS: 1.0000 | ORAL_TABLET | Freq: Three times a day (TID) | ORAL | 0 refills | Status: DC | PRN

## 2024-02-04 MED ORDER — METHOCARBAMOL 750 MG PO TABS: 750.0000 mg | ORAL_TABLET | Freq: Four times a day (QID) | ORAL | 1 refills | Status: DC

## 2024-02-04 NOTE — Progress Notes (Unsigned)
 Established Patient Office Visit  Subjective   Patient ID: Maxwell [name], male    DOB: Apr 19, 1981  Age: 43 y.o. MRN: [id number]  Chief Complaint  Patient presents with   ED Follow up    Pt states still having pain but states some improvement. Pt states needing refills.    HPI Discussed the use of AI scribe software for clinical note transcription with the patient, who gave verbal consent to proceed.  History of Present Illness   He presents with back pain and a medication refill request.  He has been experiencing back pain for about a month, which began after working out. The pain is located on the left side and radiates down the left leg, causing numbness and tingling. The pain was initially severe but is now mild. He initially sought care at the emergency room and was prescribed oxycodone and gabapentin, noting that gabapentin has been helpful.  He has a history of similar back pain three years ago, which took about three months to resolve. During the current episode, he has not had an MRI, only x-rays, and mentions that the pain flared up in February.  He has been seeing Dr. Antoine Primas for chiropractic adjustments for a couple of years, typically every couple of months, but sometimes cancels appointments if not needed.  His current medications include gabapentin 100 mg three times a day, oxycodone for severe pain, and methocarbamol, which he has run out of. He also uses a massage gun to help with circulation and pain relief.  He has resumed working out, taking it light, and is aware of which exercises to avoid to prevent further flare-ups.  His wife is pregnant and due in April, and he went through IVF for the pregnancy.         Review of Systems  Constitutional:  Negative for fever and malaise/fatigue.  HENT:  Negative for congestion.   Eyes:  Negative for blurred vision.  Respiratory:  Negative for cough and shortness of breath.   Cardiovascular:  Negative for chest  pain, palpitations and leg swelling.  Gastrointestinal:  Negative for vomiting.  Musculoskeletal:  Positive for back pain.  Skin:  Negative for rash.  Neurological:  Negative for loss of consciousness and headaches.      Objective:     BP 130/80 (BP Location: Right Arm, Patient Position: Sitting, Cuff Size: Large)   Pulse 69   Temp 98 F (36.7 C) (Oral)   Resp 18   Ht 5\' 6"  (1.676 m)   Wt 189 lb 9.6 oz (86 kg)   SpO2 98%   BMI 30.60 kg/m     Physical Exam Vitals and nursing note reviewed.  Constitutional:      General: He is not in acute distress.    Appearance: Normal appearance. He is well-developed.  HENT:     Head: Normocephalic and atraumatic.  Eyes:     General: No scleral icterus.       Right eye: No discharge.        Left eye: No discharge.  Cardiovascular:     Rate and Rhythm: Normal rate and regular rhythm.     Heart sounds: No murmur heard. Pulmonary:     Effort: Pulmonary effort is normal. No respiratory distress.     Breath sounds: Normal breath sounds.  Musculoskeletal:        General: Normal range of motion.     Cervical back: Normal range of motion and neck supple.  Right lower leg: No edema.     Left lower leg: No edema.  Skin:    General: Skin is warm and dry.  Neurological:     General: No focal deficit present.     Mental Status: He is alert and oriented to person, place, and time.     Motor: No weakness.     Coordination: Coordination normal.     Gait: Gait normal.     Deep Tendon Reflexes: Reflexes normal.  Psychiatric:        Mood and Affect: Mood normal.        Behavior: Behavior normal.        Thought Content: Thought content normal.        Judgment: Judgment normal.      No results found for any visits on 02/04/24.     The 10-year ASCVD risk score (Arnett DK, et al., 2019) is: 5.7%    Assessment & Plan:   Problem List Items Addressed This Visit       Unprioritized   Pain management - Primary   Relevant  Medications   methocarbamol (ROBAXIN) 750 MG tablet   gabapentin (NEURONTIN) 100 MG capsule   oxyCODONE-acetaminophen (PERCOCET) 5-325 MG tablet   Other Visit Diagnoses       Chronic left-sided low back pain with left-sided sciatica       Relevant Medications   methocarbamol (ROBAXIN) 750 MG tablet   gabapentin (NEURONTIN) 100 MG capsule   oxyCODONE-acetaminophen (PERCOCET) 5-325 MG tablet     Assessment and Plan    Lumbar Radiculopathy   He has been experiencing back pain radiating down the left leg for about a month, likely due to lumbar radiculopathy. Initially severe, it required multiple ER visits where he received oxycodone, gabapentin, and a steroid injection. The pain is now mild but persistent, with numbness and tingling in the left leg. He had a similar episode three years ago on the right side. It is important to avoid exercises that exacerbate symptoms. An MRI may be needed if symptoms do not improve. He is aware of the risks of long-term use of medications like oxycodone and prednisone and is interested in non-pharmacological treatments such as massage and dry needling. This type of injury typically takes about three months to heal completely, though improvement can be seen in two to three weeks with treatments like steroids, depending on severity. Prescribe gabapentin 100 mg three times a day, oxycodone for severe pain as needed, and methocarbamol for muscle relaxation. Recommend massage therapy or dry needling.        No follow-ups on file.    Donato Schultz, DO

## 2024-02-07 ENCOUNTER — Encounter: Payer: Self-pay | Admitting: Family Medicine

## 2024-02-18 NOTE — Progress Notes (Unsigned)
 [name] [address] [place] [address] Phone: [phone] Subjective:   INadine Counts, am serving as a scribe for Dr. Antoine Primas.  I'm seeing this patient by the request  of:  Sandford Craze, NP  CC: back pain follow up   NGE:[id number]  01/09/2024 [place] radicular symptoms on the left leg patient was having.  Responded extremely well to osteopathic manipulation.  Discussed icing regimen and home exercises.  Patient was given prednisone by the emergency room provider and can take it if needed but I do believe the patient is going to do fine and be ready to workout again next week.  Will follow-up with me again in 6 weeks as we stated increase activity slowly.       Updated 02/20/2024 Maxwell [name] is a 43 y.o. male coming in with complaint of back pain. Getting better since last time. Still thinks he has inflammation every now and again. Takes gabapentin lacking on working out.  XRAY FIRST PLEASE    Past Medical History:  Diagnosis Date   Asthma    childhood asthma   No past surgical history on file. Social History   Socioeconomic History   Marital status: Single    Spouse name: Not on file   Number of children: Not on file   Years of education: Not on file   Highest education level: Master's degree (e.g., MA, MS, MEng, MEd, MSW, MBA)  Occupational History   Not on file  Tobacco Use   Smoking status: Never   Smokeless tobacco: Never  Vaping Use   Vaping status: Never Used  Substance and Sexual Activity   Alcohol use: No   Drug use: Never   Sexual activity: Yes    Partners: Female  Other Topics Concern   Not on file  Social History Narrative   Married   No children   Biochemist, clinical degree   Enjoys basketball   Grew up in [place].   Social Drivers of Corporate investment banker Strain: Low Risk  (03/28/2023)   Overall Financial Resource Strain (CARDIA)    Difficulty of Paying Living Expenses: Not hard  at all  Food Insecurity: No Food Insecurity (03/28/2023)   Hunger Vital Sign    Worried About Running Out of Food in the Last Year: Never true    Ran Out of Food in the Last Year: Never true  Transportation Needs: No Transportation Needs (03/28/2023)   PRAPARE - Administrator, Civil Service (Medical): No    Lack of Transportation (Non-Medical): No  Physical Activity: Sufficiently Active (03/28/2023)   Exercise Vital Sign    Days of Exercise per Week: 5 days    Minutes of Exercise per Session: 30 min  Stress: No Stress Concern Present (03/28/2023)   [employer] of Occupational Health - Occupational Stress Questionnaire    Feeling of Stress : Not at all  Social Connections: Moderately Integrated (03/28/2023)   Social Connection and Isolation Panel [NHANES]    Frequency of Communication with Friends and Family: More than three times a week    Frequency of Social Gatherings with Friends and Family: Once a week    Attends Religious Services: More than 4 times per year    Active Member of [employer] or Organizations: No    Attends Engineer, structural: Not on file    Marital Status: Married   No Known Allergies Family History  Problem Relation Age of  Onset   Hypertension Mother    Hypertension Father    Liver disease Sister    Cancer Maternal Uncle        prostate   Diabetes Half-Sister    Heart disease Neg Hx    Kidney disease Neg Hx      Current Outpatient Medications (Cardiovascular):    amLODipine (NORVASC) 2.5 MG tablet, Take 1 tablet (2.5 mg total) by mouth daily.   Current Outpatient Medications (Analgesics):    oxyCODONE-acetaminophen (PERCOCET) 5-325 MG tablet, Take 1 tablet by mouth every 8 (eight) hours as needed.   Current Outpatient Medications (Other):    betamethasone valerate ointment (VALISONE) 0.1 %, Apply 1 Application topically daily.   gabapentin (NEURONTIN) 100 MG capsule, Take 1 capsule (100 mg total) by mouth 3 (three) times daily.    methocarbamol (ROBAXIN) 750 MG tablet, Take 1 tablet (750 mg total) by mouth 4 (four) times daily.   Reviewed prior external information including notes and imaging from  primary care provider As well as notes that were available from care everywhere and other healthcare systems.  Past medical history, social, surgical and family history all reviewed in electronic medical record.  No pertanent information unless stated regarding to the chief complaint.   Review of Systems:  No headache, visual changes, nausea, vomiting, diarrhea, constipation, dizziness, abdominal pain, skin rash, fevers, chills, night sweats, weight loss, swollen lymph nodes, body aches, joint swelling, chest pain, shortness of breath, mood changes. POSITIVE muscle aches  Objective  Blood pressure 126/70, pulse 70, height 5\' 6"  (1.676 m), weight 190 lb (86.2 kg), SpO2 98%.   General: No apparent distress alert and oriented x3 mood and affect normal, dressed appropriately.  HEENT: Pupils equal, extraocular movements intact  Respiratory: Patient's speak in full sentences and does not appear short of breath  Cardiovascular: No lower extremity edema, non tender, no erythema  Back exam shows tightness noted in the low back.  Patient does seem to have more muscular tightness than usual.  Negative straight leg test.  Worsening pain with extension noted.  Osteopathic findings C4 flexed rotated and side bent left C6 flexed rotated and side bent left T3 extended rotated and side bent right inhaled third rib T6 extended rotated and side bent left L2 flexed rotated and side bent right Sacrum right on right     Impression and Recommendations:     The above documentation has been reviewed and is accurate and complete Judi Saa, DO

## 2024-02-20 ENCOUNTER — Ambulatory Visit (INDEPENDENT_AMBULATORY_CARE_PROVIDER_SITE_OTHER)

## 2024-02-20 ENCOUNTER — Ambulatory Visit: Payer: 59 | Admitting: Family Medicine

## 2024-02-20 ENCOUNTER — Encounter: Payer: Self-pay | Admitting: Family Medicine

## 2024-02-20 VITALS — BP 126/70 | HR 70 | Ht 66.0 in | Wt 190.0 lb

## 2024-02-20 DIAGNOSIS — M9908 Segmental and somatic dysfunction of rib cage: Secondary | ICD-10-CM

## 2024-02-20 DIAGNOSIS — M51362 Other intervertebral disc degeneration, lumbar region with discogenic back pain and lower extremity pain: Secondary | ICD-10-CM | POA: Diagnosis not present

## 2024-02-20 DIAGNOSIS — M9901 Segmental and somatic dysfunction of cervical region: Secondary | ICD-10-CM

## 2024-02-20 DIAGNOSIS — M2550 Pain in unspecified joint: Secondary | ICD-10-CM

## 2024-02-20 DIAGNOSIS — M9904 Segmental and somatic dysfunction of sacral region: Secondary | ICD-10-CM

## 2024-02-20 DIAGNOSIS — M9902 Segmental and somatic dysfunction of thoracic region: Secondary | ICD-10-CM

## 2024-02-20 DIAGNOSIS — M9903 Segmental and somatic dysfunction of lumbar region: Secondary | ICD-10-CM

## 2024-02-20 MED ORDER — KETOROLAC TROMETHAMINE 60 MG/2ML IM SOLN
60.0000 mg | Freq: Once | INTRAMUSCULAR | Status: AC
  Administered 2024-02-20: 60 mg via INTRAMUSCULAR

## 2024-02-20 MED ORDER — METHYLPREDNISOLONE ACETATE 80 MG/ML IJ SUSP
80.0000 mg | Freq: Once | INTRAMUSCULAR | Status: AC
  Administered 2024-02-20: 80 mg via INTRAMUSCULAR

## 2024-02-20 NOTE — Patient Instructions (Signed)
 Cocktail injection See you again in 6-8 weeks Congrats

## 2024-02-20 NOTE — Assessment & Plan Note (Signed)
 Chronic, with exacerbation.  We discussed Toradol and Depo-Medrol injections today to help with some of the discomfort and pain.  Discussed with patient about icing regimen and home exercises.  Has a newborn baby at home and I do think some of the fatigue could be potentially playing a role.  Hopefully patient will continue to get better and better.  Follow-up with me again in 2 to 3 months patient knows worsening pain to seek medical attention immediately.  Has pain medication from primary care provider.

## 2024-02-21 ENCOUNTER — Encounter: Payer: Self-pay | Admitting: Family Medicine

## 2024-02-25 ENCOUNTER — Other Ambulatory Visit: Payer: Self-pay | Admitting: Family Medicine

## 2024-02-25 ENCOUNTER — Other Ambulatory Visit: Payer: Self-pay | Admitting: Family

## 2024-02-25 DIAGNOSIS — R52 Pain, unspecified: Secondary | ICD-10-CM

## 2024-02-25 DIAGNOSIS — G8929 Other chronic pain: Secondary | ICD-10-CM

## 2024-02-25 NOTE — Telephone Encounter (Signed)
 Requesting: oxycodone 5-325mg   Contract: None UDS: None Last Visit: 05/27/23 Next Visit: scheduled w/ Dr. Salomon Fick on 05/27/24 Last Refill: 02/04/24 #15 and 0RF   Please Advise

## 2024-02-25 NOTE — Telephone Encounter (Signed)
 Copied from CRM [id number]. Topic: Clinical - Medication Refill >> Feb 25, 2024 10:42 AM Myrtice Lauth wrote: Most Recent Primary Care Visit:  Provider: Seabron Spates R  Department: [facility]  Visit Type: OFFICE VISIT  Date: 02/04/2024  Medication: gabapentin (NEURONTIN) 100 MG capsule. oxyCODONE-acetaminophen (PERCOCET) 5-325 MG tablet   Has the patient contacted their pharmacy? Yes (Agent: If no, request that the patient contact the pharmacy for the refill. If patient does not wish to contact the pharmacy document the reason why and proceed with request.) (Agent: If yes, when and what did the pharmacy advise?)  Is this the correct pharmacy for this prescription? Yes If no, delete pharmacy and type the correct one.  This is the patient's preferred pharmacy:  [id number] [place], [place] - [address] [address] Phone: [phone] Fax: [phone]     Has the prescription been filled recently? Yes  Is the patient out of the medication? Yes  Has the patient been seen for an appointment in the last year OR does the patient have an upcoming appointment? Yes  Can we respond through MyChart? Yes  Agent: Please be advised that Rx refills may take up to 3 business days. We ask that you follow-up with your pharmacy.

## 2024-02-28 ENCOUNTER — Ambulatory Visit: Payer: Self-pay | Admitting: *Deleted

## 2024-02-28 ENCOUNTER — Other Ambulatory Visit: Payer: Self-pay

## 2024-02-28 ENCOUNTER — Other Ambulatory Visit: Payer: Self-pay | Admitting: Family

## 2024-02-28 ENCOUNTER — Telehealth: Payer: Self-pay

## 2024-02-28 ENCOUNTER — Emergency Department (HOSPITAL_BASED_OUTPATIENT_CLINIC_OR_DEPARTMENT_OTHER)
Admission: EM | Admit: 2024-02-28 | Discharge: 2024-02-28 | Disposition: A | Discharge status: Home / Self Care | Attending: Emergency Medicine | Admitting: Emergency Medicine

## 2024-02-28 DIAGNOSIS — M25552 Pain in left hip: Secondary | ICD-10-CM | POA: Diagnosis present

## 2024-02-28 DIAGNOSIS — J45909 Unspecified asthma, uncomplicated: Secondary | ICD-10-CM | POA: Diagnosis not present

## 2024-02-28 DIAGNOSIS — M5432 Sciatica, left side: Secondary | ICD-10-CM | POA: Diagnosis not present

## 2024-02-28 MED ORDER — OXYCODONE HCL 5 MG PO TABS: 5.0000 mg | ORAL_TABLET | ORAL | 0 refills | Status: DC | PRN

## 2024-02-28 MED ORDER — TIZANIDINE HCL 2 MG PO TABS
4.0000 mg | ORAL_TABLET | Freq: Once | ORAL | Status: AC
  Administered 2024-02-28: 4 mg via ORAL
  Filled 2024-02-28: qty 2

## 2024-02-28 MED ORDER — KETOROLAC TROMETHAMINE 30 MG/ML IJ SOLN: 30.0000 mg | Freq: Once | INTRAMUSCULAR | Status: DC

## 2024-02-28 MED ORDER — PREDNISONE 10 MG (21) PO TBPK
ORAL_TABLET | Freq: Every day | ORAL | 0 refills | Status: DC
Start: 2024-02-28 — End: 2024-03-12

## 2024-02-28 MED ORDER — PREDNISONE 1 MG PO TABS: 1.0000 mg | ORAL_TABLET | Freq: Once | ORAL | Status: DC

## 2024-02-28 MED ORDER — OXYCODONE-ACETAMINOPHEN 5-325 MG PO TABS
1.0000 | ORAL_TABLET | Freq: Once | ORAL | Status: AC
  Administered 2024-02-28: 1 via ORAL
  Filled 2024-02-28: qty 1

## 2024-02-28 MED ORDER — PREDNISONE 50 MG PO TABS
60.0000 mg | ORAL_TABLET | Freq: Once | ORAL | Status: AC
  Administered 2024-02-28: 60 mg via ORAL
  Filled 2024-02-28: qty 1

## 2024-02-28 MED ORDER — KETOROLAC TROMETHAMINE 30 MG/ML IJ SOLN
30.0000 mg | Freq: Once | INTRAMUSCULAR | Status: AC
  Administered 2024-02-28: 30 mg via INTRAMUSCULAR
  Filled 2024-02-28: qty 1

## 2024-02-28 NOTE — Telephone Encounter (Signed)
 Copied from CRM [id number]. Topic: Clinical - Prescription Issue >> Feb 28, 2024  8:20 AM Elizebeth Brooking wrote: Reason for CRM: Patient called in regarding  oxyCODONE-acetaminophen (PERCOCET) 5-325 MG tablet , stated he has called in to get medicaiton refilled would like a callback as to why it hasn't been refilled

## 2024-02-28 NOTE — ED Notes (Signed)
 Provider bedside discussing medications with patient.

## 2024-02-28 NOTE — Telephone Encounter (Unsigned)
 Copied from CRM [id number]. Topic: Clinical - Medication Refill >> Feb 28, 2024 11:06 AM Marya Fossa L wrote: Most Recent Primary Care Visit:  Provider: Seabron Spates R  Department: [facility]  Visit Type: OFFICE VISIT  Date: 02/04/2024  Medication: oxyCODONE-acetaminophen (PERCOCET) 5-325 MG tablet  Has the patient contacted their pharmacy? Yes (Agent: If no, request that the patient contact the pharmacy for the refill. If patient does not wish to contact the pharmacy document the reason why and proceed with request.) (Agent: If yes, when and what did the pharmacy advise?)  Is this the correct pharmacy for this prescription? Yes If no, delete pharmacy and type the correct one.  This is the patient's preferred pharmacy:  [id number] [place], [place] - [address] [facility] [address] [address] Phone: [phone] Fax: [phone]  [facility] [address] [place] [address] Phone: [phone] Fax: [phone]   Has the prescription been filled recently? Yes  Is the patient out of the medication? Yes  Has the patient been seen for an appointment in the last year OR does the patient have an upcoming appointment? Yes  Can we respond through MyChart? Yes  Agent: Please be advised that Rx refills may take up to 3 business days. We ask that you follow-up with your pharmacy.

## 2024-02-28 NOTE — ED Notes (Signed)
 Patient requesting to speak with provider regarding pain medications. Patient expresses 'the percocet is not going to be strong enough, I need something strong, I want the shot they gave me last time'. EDP notified.

## 2024-02-28 NOTE — ED Notes (Signed)
 Patient d/c instructions, medications, and follow-up care reviewed with the patient. Pt expressed understanding and any questions verbalized were answered. Patient had no further questions at time of d/c. Patient stated his wife would be driving him home. Patient was CA&Ox4, ambulatory, and in NAD at time of d/c.

## 2024-02-28 NOTE — ED Triage Notes (Signed)
 Reports left side sciatica-pain in left hip radiating into left leg. Has been seen here several times for same. Has been more sedentary recently and reports this seems to be worsening. Has gabapentin at home for pain and robaxin.

## 2024-02-28 NOTE — Discharge Instructions (Addendum)
 Your evaluated in the emergency room for sciatica exacerbation.  A prescription for prednisone taper and a few tablets of oxycodone was sent into your pharmacy.

## 2024-02-28 NOTE — Telephone Encounter (Signed)
  Chief Complaint: Medication refill request- patient states he has called 3 times today for this refill Symptoms: back pain- sciatica  Frequency: on/off- having flare  Disposition: [] ED /[] Urgent Care (no appt availability in office) / [] Appointment(In office/virtual)/ []  [facility]/ [] Home Care/ [] Refused Recommended Disposition /[] [facility]/ [x]  Follow-up with PCP Additional Notes: Explained refill process to patient- 48/72 hours for refill- due to class of this medication- provider will have to address. Parient states he is having flare and needs medication    Copied from CRM [id number]. Topic: Clinical - Red Word Triage >> Feb 28, 2024  3:54 PM [place] J wrote: [facility] that prompted transfer to Nurse Triage: In extreme pain from going without medication. Pain is in back and shooting down to left leg. Reason for Disposition . Caller requesting a CONTROLLED substance prescription refill (e.g., narcotics, ADHD medicines)  Answer Assessment - Initial Assessment Questions 1. DRUG NAME: "What medicine do you need to have refilled?"     Oxycodone  2. REFILLS REMAINING: "How many refills are remaining?" (Note: The label on the medicine or pill bottle will show how many refills are remaining. If there are no refills remaining, then a renewal may be needed.)     none 3. EXPIRATION DATE: "What is the expiration date?" (Note: The label states when the prescription will expire, and thus can no longer be refilled.)     02/04/24 #15 4. PRESCRIBING HCP: "Who prescribed it?" Reason: If prescribed by specialist, call should be referred to that group.     PCP- [facility] 5. SYMPTOMS: "Do you have any symptoms?"     Back pain- sciatica  Protocols used: Medication Refill and Renewal Call-[facility]

## 2024-02-28 NOTE — ED Provider Notes (Signed)
 [place] EMERGENCY DEPARTMENT AT [facility] Provider Note   CSN: [id number] Arrival date & time: 02/28/24  2033     History  Chief Complaint  Patient presents with   Hip Pain    Maxwell [name] is a 43 y.o. male with history of sciatica presents with exacerbation of his left-sided sciatic pain radiating down his left extremity.  Has been evaluated numerous times for this.  No injury or trauma.  No significant back pain.  Hip Pain   Past Medical History:  Diagnosis Date   Asthma    childhood asthma       Home Medications Prior to Admission medications   Medication Sig Start Date End Date Taking? Authorizing Provider  oxyCODONE (ROXICODONE) 5 MG immediate release tablet Take 1 tablet (5 mg total) by mouth every 4 (four) hours as needed for severe pain (pain score 7-10). 02/28/24  Yes Halford Decamp, PA-C  amLODipine (NORVASC) 2.5 MG tablet Take 1 tablet (2.5 mg total) by mouth daily. 06/27/23   Sandford Craze, NP  betamethasone valerate ointment (VALISONE) 0.1 % Apply 1 Application topically daily. 07/08/23   Sandford Craze, NP  gabapentin (NEURONTIN) 100 MG capsule TAKE 1 CAPSULE (100 MG TOTAL) BY MOUTH THREE TIMES DAILY. 02/25/24   Sandford Craze, NP  methocarbamol (ROBAXIN) 750 MG tablet Take 1 tablet (750 mg total) by mouth 4 (four) times daily. 02/04/24   Donato Schultz, DO  oxyCODONE-acetaminophen (PERCOCET) 5-325 MG tablet Take 1 tablet by mouth every 8 (eight) hours as needed. 02/04/24   Donato Schultz, DO      Allergies    Patient has no known allergies.    Review of Systems   Review of Systems  Musculoskeletal:  Positive for myalgias.    Physical Exam Updated Vital Signs BP (!) 146/108 (BP Location: Right Arm)   Pulse (!) 58   Temp (!) 97 F (36.1 C)   Resp 19   SpO2 96%  Physical Exam Vitals and nursing note reviewed.  Constitutional:      General: He is not in acute distress.    Appearance: He is well-developed.  HENT:      Head: Normocephalic and atraumatic.  Eyes:     Conjunctiva/sclera: Conjunctivae normal.  Cardiovascular:     Rate and Rhythm: Normal rate and regular rhythm.     Heart sounds: No murmur heard. Pulmonary:     Effort: Pulmonary effort is normal. No respiratory distress.     Breath sounds: Normal breath sounds.  Musculoskeletal:        General: No swelling.     Cervical back: Neck supple.     Comments: Positive straight leg raise on the left, no bony tenderness, no midline tenderness  Skin:    General: Skin is warm and dry.     Capillary Refill: Capillary refill takes less than 2 seconds.  Neurological:     Mental Status: He is alert.  Psychiatric:        Mood and Affect: Mood normal.     ED Results / Procedures / Treatments   Labs (all labs ordered are listed, but only abnormal results are displayed) Labs Reviewed - No data to display  EKG None  Radiology No results found.  Procedures Procedures    Medications Ordered in ED Medications  oxyCODONE-acetaminophen (PERCOCET/ROXICET) 5-325 MG per tablet 1 tablet (has no administration in time range)  tiZANidine (ZANAFLEX) tablet 4 mg (has no administration in time range)  predniSONE (DELTASONE) tablet  60 mg (has no administration in time range)  ketorolac (TORADOL) 30 MG/ML injection 30 mg (has no administration in time range)    ED Course/ Medical Decision Making/ A&P                                 Medical Decision Making  This patient presents to the ED with chief complaint(s) of leg pain.  The complaint involves an extensive differential diagnosis and also carries with it a high risk of complications and morbidity.   pertinent past medical history as listed in HPI  The differential diagnosis includes  MSK,  cauda equina syndrome, spinal stenosis, spondylitises/ spondylosis, sciatica  Additional history obtained: Records reviewed Care Everywhere/External Records  Initial Assessment:   Hemodynamically  stable patient presenting with radicular like symptoms down his left leg.  On exam patient was without focal tenderness, positive left straight leg raise. No neurological deficits and normal neuro exam.  Patient can walk but states is painful.  No loss of bowel or bladder control.  No concern for cauda equina.    Overall most consistent with exacerbation of his sciatica.  He is requesting similar treatment to last visit.  Independent ECG interpretation:  none  Independent labs interpretation:  The following labs were independently interpreted:  none  Independent visualization and interpretation of imaging: none  Treatment and Reassessment: Will give similar treatment as last time with Percocet, tizanidine, prednisone and Toradol  Of note patient is requesting shots that he was given in the past.  It appears that he had been given Dilaudid in the past.  Discussed that this is not appropriate for sciatica like pain and will not be given here today.  Reviewed his PDMP, it appears that he received 15 Percocets earlier last month.  Reports that he has a newborn and struggling at home did not care for.  Told the patient of the diet will only give him 3 tablets for pain in addition to a prednisone taper.  He should follow-up with his primary care doctor for further management.  Consultations obtained:   none  Disposition:   Patient will be discharged home.  Prescription for steroid taper sent into pharmacy. The patient has been appropriately medically screened and/or stabilized in the ED. I have low suspicion for any other emergent medical condition which would require further screening, evaluation or treatment in the ED or require inpatient management. At time of discharge the patient is hemodynamically stable and in no acute distress. I have discussed work-up results and diagnosis with patient and answered all questions. Patient is agreeable with discharge plan. We discussed strict return precautions  for returning to the emergency department and they verbalized understanding.     Social Determinants of Health:   none  This note was dictated with voice recognition software.  Despite best efforts at proofreading, errors may have occurred which can change the documentation meaning.           Final Clinical Impression(s) / ED Diagnoses Final diagnoses:  Sciatica of left side    Rx / DC Orders ED Discharge Orders          Ordered    oxyCODONE (ROXICODONE) 5 MG immediate release tablet  Every 4 hours PRN        02/28/24 2250              Fabienne Bruns 02/28/24 2253    Anders Simmonds  T, DO 02/28/24 2328

## 2024-03-02 NOTE — Telephone Encounter (Signed)
 Provider responded, see other message

## 2024-03-03 ENCOUNTER — Other Ambulatory Visit: Payer: Self-pay | Admitting: Family Medicine

## 2024-03-03 DIAGNOSIS — R52 Pain, unspecified: Secondary | ICD-10-CM

## 2024-03-03 DIAGNOSIS — G8929 Other chronic pain: Secondary | ICD-10-CM

## 2024-03-03 NOTE — Telephone Encounter (Signed)
 Copied from CRM [id number]. Topic: Clinical - Medication Refill >> Mar 03, 2024 12:20 PM Efraim Kaufmann C wrote: Most Recent Primary Care Visit:  Provider: Seabron Spates R  Department: [facility]  Visit Type: OFFICE VISIT  Date: 02/04/2024  Medication: oxyCODONE-acetaminophen (PERCOCET) 5-325 MG tablet  Has the patient contacted their pharmacy? No (Agent: If no, request that the patient contact the pharmacy for the refill. If patient does not wish to contact the pharmacy document the reason why and proceed with request.) (Agent: If yes, when and what did the pharmacy advise?)  Is this the correct pharmacy for this prescription? Yes If no, delete pharmacy and type the correct one.  This is the patient's preferred pharmacy:  [id number] [place], [place] - [address] [place] [address] Phone: [phone] Fax: [phone]    Has the prescription been filled recently? No  Is the patient out of the medication? Yes  Has the patient been seen for an appointment in the last year OR does the patient have an upcoming appointment? Yes  Can we respond through MyChart? Yes  Agent: Please be advised that Rx refills may take up to 3 business days. We ask that you follow-up with your pharmacy. *note*patient stated that even though he has a different PCP, the authorizing provider is the one that should be listed on medication as she is the one he saw last and she prescribed this medication and would sign off on it*

## 2024-03-03 NOTE — Telephone Encounter (Signed)
 Last Fill: 02/04/24 15 tabs/0 RF  Last OV: 02/04/24 Next OV: 05/27/24  Routing to provider for review/authorization.

## 2024-03-12 ENCOUNTER — Ambulatory Visit (INDEPENDENT_AMBULATORY_CARE_PROVIDER_SITE_OTHER): Admitting: Family Medicine

## 2024-03-12 VITALS — BP 124/74 | HR 71 | Temp 98.0°F | Resp 16 | Ht 66.0 in | Wt 196.4 lb

## 2024-03-12 DIAGNOSIS — G8929 Other chronic pain: Secondary | ICD-10-CM

## 2024-03-12 DIAGNOSIS — M5442 Lumbago with sciatica, left side: Secondary | ICD-10-CM

## 2024-03-12 DIAGNOSIS — R52 Pain, unspecified: Secondary | ICD-10-CM

## 2024-03-12 MED ORDER — METHOCARBAMOL 750 MG PO TABS: 750.0000 mg | ORAL_TABLET | Freq: Four times a day (QID) | ORAL | 1 refills | Status: DC

## 2024-03-12 MED ORDER — OXYCODONE HCL 5 MG PO TABS: 5.0000 mg | ORAL_TABLET | ORAL | 0 refills | Status: DC | PRN

## 2024-03-12 MED ORDER — GABAPENTIN 100 MG PO CAPS: ORAL_CAPSULE | ORAL | 0 refills | Status: DC

## 2024-03-12 NOTE — Patient Instructions (Signed)
 Acute Back Pain, Adult Acute back pain is sudden and usually short-lived. It is often caused by an injury to the muscles and tissues in the back. The injury may result from: A muscle, tendon, or ligament getting overstretched or torn. Ligaments are tissues that connect bones to each other. Lifting something improperly can cause a back strain. Wear and tear (degeneration) of the spinal disks. Spinal disks are circular tissue that provide cushioning between the bones of the spine (vertebrae). Twisting motions, such as while playing sports or doing yard work. A hit to the back. Arthritis. You may have a physical exam, lab tests, and imaging tests to find the cause of your pain. Acute back pain usually goes away with rest and home care. Follow these instructions at home: Managing pain, stiffness, and swelling Take over-the-counter and prescription medicines only as told by your health care provider. Treatment may include medicines for pain and inflammation that are taken by mouth or applied to the skin, or muscle relaxants. Your health care provider may recommend applying ice during the first 24-48 hours after your pain starts. To do this: Put ice in a plastic bag. Place a towel between your skin and the bag. Leave the ice on for 20 minutes, 2-3 times a day. Remove the ice if your skin turns bright red. This is very important. If you cannot feel pain, heat, or cold, you have a greater risk of damage to the area. If directed, apply heat to the affected area as often as told by your health care provider. Use the heat source that your health care provider recommends, such as a moist heat pack or a heating pad. Place a towel between your skin and the heat source. Leave the heat on for 20-30 minutes. Remove the heat if your skin turns bright red. This is especially important if you are unable to feel pain, heat, or cold. You have a greater risk of getting burned. Activity  Do not stay in bed. Staying in  bed for more than 1-2 days can delay your recovery. Sit up and stand up straight. Avoid leaning forward when you sit or hunching over when you stand. If you work at a desk, sit close to it so you do not need to lean over. Keep your chin tucked in. Keep your neck drawn back, and keep your elbows bent at a 90-degree angle (right angle). Sit high and close to the steering wheel when you drive. Add lower back (lumbar) support to your car seat, if needed. Take short walks on even surfaces as soon as you are able. Try to increase the length of time you walk each day. Do not sit, drive, or stand in one place for more than 30 minutes at a time. Sitting or standing for long periods of time can put stress on your back. Do not drive or use heavy machinery while taking prescription pain medicine. Use proper lifting techniques. When you bend and lift, use positions that put less stress on your back: [place] your knees. Keep the load close to your body. Avoid twisting. Exercise regularly as told by your health care provider. Exercising helps your back heal faster and helps prevent back injuries by keeping muscles strong and flexible. Work with a physical therapist to make a safe exercise program, as recommended by your health care provider. Do any exercises as told by your physical therapist. Lifestyle Maintain a healthy weight. Extra weight puts stress on your back and makes it difficult to have good  posture. Avoid activities or situations that make you feel anxious or stressed. Stress and anxiety increase muscle tension and can make back pain worse. Learn ways to manage anxiety and stress, such as through exercise. General instructions Sleep on a firm mattress in a comfortable position. Try lying on your side with your knees slightly bent. If you lie on your back, put a pillow under your knees. Keep your head and neck in a straight line with your spine (neutral position) when using electronic equipment like  smartphones or pads. To do this: Raise your smartphone or pad to look at it instead of bending your head or neck to look down. Put the smartphone or pad at the level of your face while looking at the screen. Follow your treatment plan as told by your health care provider. This may include: Cognitive or behavioral therapy. Acupuncture or massage therapy. Meditation or yoga. Contact a health care provider if: You have pain that is not relieved with rest or medicine. You have increasing pain going down into your legs or buttocks. Your pain does not improve after 2 weeks. You have pain at night. You lose weight without trying. You have a fever or chills. You develop nausea or vomiting. You develop abdominal pain. Get help right away if: You develop new bowel or bladder control problems. You have unusual weakness or numbness in your arms or legs. You feel faint. These symptoms may represent a serious problem that is an emergency. Do not wait to see if the symptoms will go away. Get medical help right away. Call your local emergency services (911 in the [place] Do not drive yourself to the hospital. Summary Acute back pain is sudden and usually short-lived. Use proper lifting techniques. When you bend and lift, use positions that put less stress on your back. Take over-the-counter and prescription medicines only as told by your health care provider, and apply heat or ice as told. This information is not intended to replace advice given to you by your health care provider. Make sure you discuss any questions you have with your health care provider. Document Revised: 02/03/2021 Document Reviewed: 02/03/2021 Elsevier Patient Education  2024 [employer].

## 2024-03-12 NOTE — Progress Notes (Signed)
 Established Patient Office Visit  Subjective   Patient ID: Maxwell [name], male    DOB: [birth year]  Age: 43 y.o. MRN: [id number]  Chief Complaint  Patient presents with   Pain Management    HPI Discussed the use of AI scribe software for clinical note transcription with the patient, who gave verbal consent to proceed.  History of Present Illness Maxwell [name] is a 43 year old male who presents with recurrent back pain and sciatica flare-ups.  He has been experiencing recurrent back pain and sciatica flare-ups, which have been somewhat alleviated by yoga. A recent flare-up occurred due to a lack of medication. The pain is associated with the sciatic nerve and has been described as a 'patient nerve'.  He has previously tried inversion tables but had a negative experience with a chiropractor who applied excessive pressure, initially causing his back issues. His current medication regimen includes gabapentin at a low dose three times daily, though he is uncertain of its effectiveness. He also has tizanidine, which he does not use, and methocarbamol and oxycodone for severe pain. Tizanidine was ineffective for him.  Recently, he received an adjustment and a steroid injection, along with x-rays that showed no changes in the bones. A significant flare-up occurred after spending six days in a reclining chair while caring for his newborn, leading to an emergency room visit. His previous back issue in 2020 or 2022 resolved in three months, but the current issue persists and has shifted from the right to the left side.  He has a family history of similar issues, with his mother and sister receiving shots for nerve pain. He is cautious about taking medications due to potential side effects and interactions, particularly between gabapentin and oxycodone.  No claustrophobia. Gabapentin does not cause sleepiness.   Patient Active Problem List   Diagnosis Date Noted   Degenerative disc disease, lumbar  01/09/2024   Pain management 05/27/2023   Skin rash 03/29/2023   Primary hypertension 03/29/2023   Somatic dysfunction of spine, lumbar 06/22/2021   Routine general medical examination at a health care facility 02/12/2014   Past Medical History:  Diagnosis Date   Asthma    childhood asthma   No past surgical history on file. Social History   Tobacco Use   Smoking status: Never   Smokeless tobacco: Never  Vaping Use   Vaping status: Never Used  Substance Use Topics   Alcohol use: No   Drug use: Never   Social History   Socioeconomic History   Marital status: Single    Spouse name: Not on file   Number of children: Not on file   Years of education: Not on file   Highest education level: Master's degree (e.g., MA, MS, MEng, MEd, MSW, MBA)  Occupational History   Not on file  Tobacco Use   Smoking status: Never   Smokeless tobacco: Never  Vaping Use   Vaping status: Never Used  Substance and Sexual Activity   Alcohol use: No   Drug use: Never   Sexual activity: Yes    Partners: Female  Other Topics Concern   Not on file  Social History Narrative   Married   No children   Teaching laboratory technician   Bachelors degree   Enjoys basketball   Grew up in [place].   Social Drivers of Corporate investment banker Strain: Low Risk  (03/12/2024)   Overall Financial Resource Strain (CARDIA)    Difficulty of Paying Living Expenses: Not very hard  Food Insecurity: No Food Insecurity (03/12/2024)   Hunger Vital Sign    Worried About Running Out of Food in the Last Year: Never true    Ran Out of Food in the Last Year: Never true  Transportation Needs: No Transportation Needs (03/12/2024)   PRAPARE - Administrator, Civil Service (Medical): No    Lack of Transportation (Non-Medical): No  Physical Activity: Sufficiently Active (03/12/2024)   Exercise Vital Sign    Days of Exercise per Week: 5 days    Minutes of Exercise per Session: 30 min  Stress: No Stress Concern  Present (03/12/2024)   [employer] of Occupational Health - Occupational Stress Questionnaire    Feeling of Stress : Not at all  Social Connections: Moderately Integrated (03/12/2024)   Social Connection and Isolation Panel [NHANES]    Frequency of Communication with Friends and Family: More than three times a week    Frequency of Social Gatherings with Friends and Family: Never    Attends Religious Services: More than 4 times per year    Active Member of [employer] or Organizations: No    Attends Engineer, structural: Not on file    Marital Status: Married  Catering manager Violence: Not on file   Family Status  Relation Name Status   Mother  Alive       healthy   Father  Alive       healthy   Sister  Deceased   Brother  Deceased       died from complications of alcohol abuse at age 60   Brother  Alive   Mat Education officer, community  (Not Specified)   H Sister  Deceased   H Sister  Alive   Neg Hx  (Not Specified)  No partnership data on file   Family History  Problem Relation Age of Onset   Hypertension Mother    Hypertension Father    Liver disease Sister    Cancer Maternal Uncle        prostate   Diabetes Half-Sister    Heart disease Neg Hx    Kidney disease Neg Hx    No Known Allergies    Review of Systems  Constitutional:  Negative for chills, fever and malaise/fatigue.  HENT:  Negative for congestion and hearing loss.   Eyes:  Negative for blurred vision and discharge.  Respiratory:  Negative for cough, sputum production and shortness of breath.   Cardiovascular:  Negative for chest pain, palpitations and leg swelling.  Gastrointestinal:  Negative for abdominal pain, blood in stool, constipation, diarrhea, heartburn, nausea and vomiting.  Genitourinary:  Negative for dysuria, frequency, hematuria and urgency.  Musculoskeletal:  Positive for back pain. Negative for falls and myalgias.  Skin:  Negative for rash.  Neurological:  Negative for dizziness, sensory change,  loss of consciousness, weakness and headaches.  Endo/Heme/Allergies:  Negative for environmental allergies. Does not bruise/bleed easily.  Psychiatric/Behavioral:  Negative for depression and suicidal ideas. The patient is not nervous/anxious and does not have insomnia.       Objective:     BP 124/74 (BP Location: Right Arm, Patient Position: Sitting, Cuff Size: Large)   Pulse 71   Temp 98 F (36.7 C) (Oral)   Resp 16   Ht 5\' 6"  (1.676 m)   Wt 196 lb 6.4 oz (89.1 kg)   SpO2 96%   BMI 31.70 kg/m  BP Readings from Last 3 Encounters:  03/12/24 124/74  02/28/24 (!) 146/108  02/20/24 126/70  Wt Readings from Last 3 Encounters:  03/12/24 196 lb 6.4 oz (89.1 kg)  02/20/24 190 lb (86.2 kg)  02/04/24 189 lb 9.6 oz (86 kg)   SpO2 Readings from Last 3 Encounters:  03/12/24 96%  02/28/24 96%  02/20/24 98%    Physical Exam Vitals and nursing note reviewed.  Constitutional:      General: He is not in acute distress.    Appearance: Normal appearance. He is well-developed.  HENT:     Head: Normocephalic and atraumatic.  Eyes:     General: No scleral icterus.       Right eye: No discharge.        Left eye: No discharge.  Cardiovascular:     Rate and Rhythm: Normal rate and regular rhythm.     Heart sounds: No murmur heard. Pulmonary:     Effort: Pulmonary effort is normal. No respiratory distress.     Breath sounds: Normal breath sounds.  Musculoskeletal:        General: Tenderness present. Normal range of motion.     Cervical back: Normal range of motion and neck supple.     Right lower leg: No edema.     Left lower leg: No edema.  Skin:    General: Skin is warm and dry.  Neurological:     General: No focal deficit present.     Mental Status: He is alert and oriented to person, place, and time.     Motor: No weakness.     Coordination: Coordination normal.     Gait: Gait normal.  Psychiatric:        Mood and Affect: Mood normal.        Behavior: Behavior normal.         Thought Content: Thought content normal.        Judgment: Judgment normal.      No results found for any visits on 03/12/24.  Last CBC Lab Results  Component Value Date   WBC 5.3 08/14/2019   HGB 15.4 08/14/2019   HCT 46.1 08/14/2019   MCV 88.3 08/14/2019   MCH 29.5 08/14/2019   RDW 13.3 08/14/2019   PLT 237 08/14/2019   Last metabolic panel Lab Results  Component Value Date   GLUCOSE 91 05/27/2023   NA 139 05/27/2023   K 4.5 05/27/2023   CL 103 05/27/2023   CO2 31 05/27/2023   BUN 21 05/27/2023   CREATININE 1.35 05/27/2023   GFR 65.19 05/27/2023   CALCIUM 9.7 05/27/2023   PROT 7.3 05/27/2023   ALBUMIN 4.2 05/27/2023   BILITOT 0.8 05/27/2023   ALKPHOS 74 05/27/2023   AST 23 05/27/2023   ALT 13 05/27/2023   ANIONGAP 9 08/14/2019   Last lipids Lab Results  Component Value Date   CHOL 145 04/21/2021   HDL 43 04/21/2021   LDLCALC 87 04/21/2021   TRIG 60 04/21/2021   CHOLHDL 3.4 04/21/2021   Last hemoglobin A1c No results found for: "HGBA1C" Last thyroid functions Lab Results  Component Value Date   TSH 0.62 02/16/2019   Last vitamin D No results found for: "25OHVITD2", "25OHVITD3", "VD25OH" Last vitamin B12 and Folate No results found for: "VITAMINB12", "FOLATE"    The 10-year ASCVD risk score (Arnett DK, et al., 2019) is: 5.2%    Assessment & Plan:   Problem List Items Addressed This Visit       Unprioritized   Pain management - Primary   Relevant Medications   methocarbamol (ROBAXIN) 750 MG tablet  gabapentin (NEURONTIN) 100 MG capsule   Other Relevant Orders   Drug Monitoring Panel [phone] , Urine   Other Visit Diagnoses       Chronic left-sided low back pain with left-sided sciatica       Relevant Medications   methocarbamol (ROBAXIN) 750 MG tablet   oxyCODONE (ROXICODONE) 5 MG immediate release tablet   gabapentin (NEURONTIN) 100 MG capsule   Other Relevant Orders   MR Lumbar Spine Wo Contrast      Assessment and  Plan Assessment & Plan Chronic Low Back Pain with Sciatica   Chronic low back pain with sciatica affects the left side, with intermittent flare-ups. Yoga has been beneficial, but recent prolonged sitting in a reclining chair worsened the condition. Previous x-rays showed no bone abnormalities. He is concerned about potential surgery and prefers non-surgical interventions. Gabapentin is used for nerve pain, but its efficacy is uncertain. He has a low pain tolerance and is cautious about medication use. Order an MRI to assess discs and ligaments. Prescribe methocarbamol and oxycodone for severe pain as needed. Increase gabapentin to 200 mg three times daily, with a gradual increase to avoid side effects. Advise continuation of yoga and stretching exercises. Discuss potential referral to a specialist if symptoms persist.   Return if symptoms worsen or fail to improve.    Leodis Alcocer R Lowne Chase, DO

## 2024-03-14 LAB — DM TEMPLATE

## 2024-03-14 LAB — DRUG MONITORING PANEL 376104, URINE
Amphetamines: NEGATIVE ng/mL (ref <500)
Barbiturates: NEGATIVE ng/mL (ref <300)
Benzodiazepines: NEGATIVE ng/mL (ref <100)
Cocaine Metabolite: NEGATIVE ng/mL (ref <150)
Desmethyltramadol: NEGATIVE ng/mL (ref <100)
Opiates: NEGATIVE ng/mL (ref <100)
Oxycodone: NEGATIVE ng/mL (ref <100)
Tramadol: NEGATIVE ng/mL (ref <100)

## 2024-03-16 ENCOUNTER — Other Ambulatory Visit: Payer: Self-pay | Admitting: Family Medicine

## 2024-03-16 ENCOUNTER — Encounter: Payer: Self-pay | Admitting: Family Medicine

## 2024-03-16 DIAGNOSIS — G8929 Other chronic pain: Secondary | ICD-10-CM

## 2024-03-16 DIAGNOSIS — M5442 Lumbago with sciatica, left side: Secondary | ICD-10-CM

## 2024-03-16 DIAGNOSIS — R52 Pain, unspecified: Secondary | ICD-10-CM

## 2024-03-16 MED ORDER — OXYCODONE-ACETAMINOPHEN 5-325 MG PO TABS: 1.0000 | ORAL_TABLET | Freq: Three times a day (TID) | ORAL | 0 refills | Status: DC | PRN

## 2024-04-02 ENCOUNTER — Other Ambulatory Visit

## 2024-04-09 NOTE — Telephone Encounter (Signed)
 Requesting: oxycodone   Contract: 03/12/24 UDS: 03/12/24 Last Visit: 03/12/24 Next Visit: None Last Refill: see med list   Please Advise

## 2024-04-10 MED ORDER — OXYCODONE-ACETAMINOPHEN 5-325 MG PO TABS: 1.0000 | ORAL_TABLET | Freq: Three times a day (TID) | ORAL | 0 refills | Status: AC | PRN

## 2024-04-10 MED ORDER — GABAPENTIN 100 MG PO CAPS: 200.0000 mg | ORAL_CAPSULE | Freq: Three times a day (TID) | ORAL | 2 refills | Status: DC

## 2024-04-10 MED ORDER — METHOCARBAMOL 750 MG PO TABS: 750.0000 mg | ORAL_TABLET | Freq: Four times a day (QID) | ORAL | 1 refills | Status: DC

## 2024-04-10 NOTE — Progress Notes (Deleted)
  [facility] [address] [place] [address] Phone: [phone] Subjective:    I'm seeing this patient by the request  of:  Dorrene Gaucher, NP  CC: back and neck pain follow up   BJY:[id number]  Corry Johanson is a 43 y.o. male coming in with complaint of back and neck pain. OMT on 02/20/2024. Patient states   Medications patient has been prescribed:   Taking:         Reviewed prior external information including notes and imaging from previsou exam, outside providers and external EMR if available.   As well as notes that were available from care everywhere and other healthcare systems.  Past medical history, social, surgical and family history all reviewed in electronic medical record.  No pertanent information unless stated regarding to the chief complaint.   Past Medical History:  Diagnosis Date   Asthma    childhood asthma    No Known Allergies   Review of Systems:  No headache, visual changes, nausea, vomiting, diarrhea, constipation, dizziness, abdominal pain, skin rash, fevers, chills, night sweats, weight loss, swollen lymph nodes, body aches, joint swelling, chest pain, shortness of breath, mood changes. POSITIVE muscle aches  Objective  There were no vitals taken for this visit.   General: No apparent distress alert and oriented x3 mood and affect normal, dressed appropriately.  HEENT: Pupils equal, extraocular movements intact  Respiratory: Patient's speak in full sentences and does not appear short of breath  Cardiovascular: No lower extremity edema, non tender, no erythema  Gait MSK:  Back   Osteopathic findings  C2 flexed rotated and side bent right C6 flexed rotated and side bent left T3 extended rotated and side bent right inhaled rib T9 extended rotated and side bent left L2 flexed rotated and side bent right Sacrum right on right       Assessment and Plan:  No problem-specific Assessment & Plan  notes found for this encounter.    Nonallopathic problems  Decision today to treat with OMT was based on Physical Exam  After verbal consent patient was treated with HVLA, ME, FPR techniques in cervical, rib, thoracic, lumbar, and sacral  areas  Patient tolerated the procedure well with improvement in symptoms  Patient given exercises, stretches and lifestyle modifications  See medications in patient instructions if given  Patient will follow up in 4-8 weeks    The above documentation has been reviewed and is accurate and complete Mailen Newborn M Calene Paradiso, DO          Note: This dictation was prepared with Dragon dictation along with smaller phrase technology. Any transcriptional errors that result from this process are unintentional.

## 2024-04-13 ENCOUNTER — Ambulatory Visit: Admitting: Family Medicine

## 2024-05-15 ENCOUNTER — Other Ambulatory Visit

## 2024-05-18 NOTE — Progress Notes (Signed)
  [facility] [address] [place] [address] Phone: [phone] Subjective:   Maxwell [name], am serving as a scribe for Dr. Arthea Claudene.  I'm seeing this patient by the request  of:  Daryl Setter, NP  CC: Back and neck pain  YEP:Dlagzrupcz  Maxwell [name] is a 43 y.o. male coming in with complaint of back and neck pain. OMT on 02/20/2024. Patient states that he has been doing well.   Medications patient has been prescribed:   Taking:         Reviewed prior external information including notes and imaging from previsou exam, outside providers and external EMR if available.   As well as notes that were available from care everywhere and other healthcare systems.  Past medical history, social, surgical and family history all reviewed in electronic medical record.  No pertanent information unless stated regarding to the chief complaint.   Past Medical History:  Diagnosis Date   Asthma    childhood asthma    No Known Allergies   Review of Systems:  No headache, visual changes, nausea, vomiting, diarrhea, constipation, dizziness, abdominal pain, skin rash, fevers, chills, night sweats, weight loss, swollen lymph nodes, body aches, joint swelling, chest pain, shortness of breath, mood changes. POSITIVE muscle aches  Objective  Blood pressure 112/88, pulse (!) 56, height 5' 6 (1.676 m), weight 197 lb (89.4 kg), SpO2 97%.   General: No apparent distress alert and oriented x3 mood and affect normal, dressed appropriately.  HEENT: Pupils equal, extraocular movements intact  Respiratory: Patient's speak in full sentences and does not appear short of breath  Cardiovascular: No lower extremity edema, non tender, no erythema  Gait MSK:  Back continued tightness noted mostly in the hip flexors.  Seems lower back anywhere else given extension of the back negative straight leg test noted.  Significant tightness of hip flexor  Osteopathic  findings  C7 flexed rotated and side bent left T3 extended rotated and side bent right inhaled rib T9 extended rotated and side bent left L1 flexed rotated and side bent right Sacrum right on right       Assessment and Plan:  Degenerative disc disease, lumbar He continues to have tightness of the hip flexors that are likely protected.  Still has a muscle imbalance of the hip abductors.  Discussed icing regimen and home exercises, discussed which activities to do and which ones to avoid.  Patient is going to change his workout routine which I think will be for the best.  Follow-up again in 6 to 8 weeks    Nonallopathic problems  Decision today to treat with OMT was based on Physical Exam  After verbal consent patient was treated with HVLA, ME, FPR techniques in cervical, rib, thoracic, lumbar, and sacral  areas  Patient tolerated the procedure well with improvement in symptoms  Patient given exercises, stretches and lifestyle modifications  See medications in patient instructions if given  Patient will follow up in 4-8 weeks             Note: This dictation was prepared with Dragon dictation along with smaller phrase technology. Any transcriptional errors that result from this process are unintentional.

## 2024-05-25 ENCOUNTER — Ambulatory Visit (INDEPENDENT_AMBULATORY_CARE_PROVIDER_SITE_OTHER): Admitting: Family Medicine

## 2024-05-25 ENCOUNTER — Encounter: Payer: Self-pay | Admitting: Family Medicine

## 2024-05-25 VITALS — BP 112/88 | HR 56 | Ht 66.0 in | Wt 197.0 lb

## 2024-05-25 DIAGNOSIS — M9903 Segmental and somatic dysfunction of lumbar region: Secondary | ICD-10-CM

## 2024-05-25 DIAGNOSIS — M9901 Segmental and somatic dysfunction of cervical region: Secondary | ICD-10-CM

## 2024-05-25 DIAGNOSIS — M9908 Segmental and somatic dysfunction of rib cage: Secondary | ICD-10-CM

## 2024-05-25 DIAGNOSIS — M9902 Segmental and somatic dysfunction of thoracic region: Secondary | ICD-10-CM | POA: Diagnosis not present

## 2024-05-25 DIAGNOSIS — M9904 Segmental and somatic dysfunction of sacral region: Secondary | ICD-10-CM | POA: Diagnosis not present

## 2024-05-25 DIAGNOSIS — M51362 Other intervertebral disc degeneration, lumbar region with discogenic back pain and lower extremity pain: Secondary | ICD-10-CM

## 2024-05-25 NOTE — Patient Instructions (Signed)
 See me in 2 months Keep enjoying time with your son

## 2024-05-25 NOTE — Assessment & Plan Note (Signed)
 He continues to have tightness of the hip flexors that are likely protected.  Still has a muscle imbalance of the hip abductors.  Discussed icing regimen and home exercises, discussed which activities to do and which ones to avoid.  Patient is going to change his workout routine which I think will be for the best.  Follow-up again in 6 to 8 weeks

## 2024-05-27 ENCOUNTER — Encounter: Payer: 59 | Admitting: Family Medicine

## 2024-07-07 ENCOUNTER — Other Ambulatory Visit: Payer: Self-pay | Admitting: Family

## 2024-07-07 ENCOUNTER — Other Ambulatory Visit

## 2024-07-07 DIAGNOSIS — G8929 Other chronic pain: Secondary | ICD-10-CM

## 2024-07-07 DIAGNOSIS — R52 Pain, unspecified: Secondary | ICD-10-CM

## 2024-07-07 NOTE — Telephone Encounter (Signed)
 Copied from CRM [id number]. Topic: Clinical - Medication Refill >> Jul 07, 2024  3:50 PM Savanna F wrote: Medication: oxyCODONE -acetaminophen  (PERCOCET) 5-325 MG tablet  methocarbamol  (ROBAXIN ) 750 MG tablet    Has the patient contacted their pharmacy? No, said he has to call the PCP first (Agent: If no, request that the patient contact the pharmacy for the refill. If patient does not wish to contact the pharmacy document the reason why and proceed with request.) (Agent: If yes, when and what did the pharmacy advise?)  This is the patient's preferred pharmacy:  [id number] [place] [place] - [address] [place] [address] Phone: [phone] Fax: [phone]   Is this the correct pharmacy for this prescription? Yes If no, delete pharmacy and type the correct one.   Has the prescription been filled recently? No  Is the patient out of the medication? Yes  Has the patient been seen for an appointment in the last year OR does the patient have an upcoming appointment? Yes  Can we respond through MyChart? Yes  Agent: Please be advised that Rx refills may take up to 3 business days. We ask that you follow-up with your pharmacy.

## 2024-07-08 MED ORDER — METHOCARBAMOL 750 MG PO TABS
750.0000 mg | ORAL_TABLET | Freq: Four times a day (QID) | ORAL | 1 refills | Status: AC
Start: 2024-07-08 — End: ?

## 2024-07-08 NOTE — Telephone Encounter (Signed)
 Controlled med request, no future appointment scheduled. Please advise

## 2024-07-10 ENCOUNTER — Other Ambulatory Visit: Payer: Self-pay | Admitting: Family

## 2024-07-10 DIAGNOSIS — G8929 Other chronic pain: Secondary | ICD-10-CM

## 2024-07-10 DIAGNOSIS — R52 Pain, unspecified: Secondary | ICD-10-CM

## 2024-07-10 MED ORDER — GABAPENTIN 100 MG PO CAPS
200.0000 mg | ORAL_CAPSULE | Freq: Three times a day (TID) | ORAL | 0 refills | Status: AC
Start: 2024-07-10 — End: ?

## 2024-07-10 NOTE — Telephone Encounter (Signed)
 Requesting: oxycodone  Contract: 03/12/24 UDS: 03/12/24 Last Visit:  03/12/24 w/ Antonio Next Visit: None, transferring care to Dr. Mercer 2/26 Last Refill: 04/10/24 #15 and 0RF   Please Advise

## 2024-07-10 NOTE — Telephone Encounter (Signed)
 Copied from CRM #[id number]. Topic: Clinical - Medication Refill >> Jul 10, 2024 12:20 PM [place] E wrote: Medication: gabapentin  (NEURONTIN ) 100 MG capsule oxyCODONE -acetaminophen  (PERCOCET) 5-325 MG tablet   Has the patient contacted their pharmacy? No (Agent: If no, request that the patient contact the pharmacy for the refill. If patient does not wish to contact the pharmacy document the reason why and proceed with request.) (Agent: If yes, when and what did the pharmacy advise?)  This is the patient's preferred pharmacy:  [id number] [place], [place] - [address] [place] [address] Phone: [phone] Fax: [phone]   Is this the correct pharmacy for this prescription? Yes If no, delete pharmacy and type the correct one.   Has the prescription been filled recently? No  Is the patient out of the medication? Yes  Has the patient been seen for an appointment in the last year OR does the patient have an upcoming appointment? Yes  Can we respond through MyChart? Yes  Agent: Please be advised that Rx refills may take up to 3 business days. We ask that you follow-up with your pharmacy.

## 2024-07-22 NOTE — Progress Notes (Deleted)
  [name] [address] [place] [address] Phone: [phone] Subjective:    I'm seeing this patient by the request  of:  Daryl Setter, NP  CC: Back and neck pain follow-up  [name]  Maxwell [name] is a 43 y.o. male coming in with complaint of back and neck pain. OMT on 05/25/2024. Patient states   Medications patient has been prescribed:   Taking:         Reviewed prior external information including notes and imaging from previsou exam, outside providers and external EMR if available.   As well as notes that were available from care everywhere and other healthcare systems.  Past medical history, social, surgical and family history all reviewed in electronic medical record.  No pertanent information unless stated regarding to the chief complaint.   Past Medical History:  Diagnosis Date   Asthma    childhood asthma    No Known Allergies   Review of Systems:  No headache, visual changes, nausea, vomiting, diarrhea, constipation, dizziness, abdominal pain, skin rash, fevers, chills, night sweats, weight loss, swollen lymph nodes, body aches, joint swelling, chest pain, shortness of breath, mood changes. POSITIVE muscle aches  Objective  There were no vitals taken for this visit.   General: No apparent distress alert and oriented x3 mood and affect normal, dressed appropriately.  HEENT: Pupils equal, extraocular movements intact  Respiratory: Patient's speak in full sentences and does not appear short of breath  Cardiovascular: No lower extremity edema, non tender, no erythema  Gait MSK:  Back   Osteopathic findings  C2 flexed rotated and side bent right C6 flexed rotated and side bent left T3 extended rotated and side bent right inhaled rib T9 extended rotated and side bent left L2 flexed rotated and side bent right Sacrum right on right       Assessment and Plan:  No problem-specific Assessment & Plan notes  found for this encounter.    Nonallopathic problems  Decision today to treat with OMT was based on Physical Exam  After verbal consent patient was treated with HVLA, ME, FPR techniques in cervical, rib, thoracic, lumbar, and sacral  areas  Patient tolerated the procedure well with improvement in symptoms  Patient given exercises, stretches and lifestyle modifications  See medications in patient instructions if given  Patient will follow up in 4-8 weeks    The above documentation has been reviewed and is accurate and complete Tranice Laduke M Maccoy Haubner, DO          Note: This dictation was prepared with Dragon dictation along with smaller phrase technology. Any transcriptional errors that result from this process are unintentional.

## 2024-07-28 ENCOUNTER — Ambulatory Visit: Admitting: Family Medicine

## 2024-09-02 NOTE — Progress Notes (Deleted)
  [facility] [address] [place] [address] Phone: [phone] Subjective:    I'm seeing this patient by the request  of:  Daryl Setter, NP  CC:   YEP:Dlagzrupcz  Maxwell [name] is a 43 y.o. male coming in with complaint of back and neck pain. OMT on 05/25/2024. Patient states   Medications patient has been prescribed:   Taking:         Reviewed prior external information including notes and imaging from previsou exam, outside providers and external EMR if available.   As well as notes that were available from care everywhere and other healthcare systems.  Past medical history, social, surgical and family history all reviewed in electronic medical record.  No pertanent information unless stated regarding to the chief complaint.   Past Medical History:  Diagnosis Date   Asthma    childhood asthma    No Known Allergies   Review of Systems:  No headache, visual changes, nausea, vomiting, diarrhea, constipation, dizziness, abdominal pain, skin rash, fevers, chills, night sweats, weight loss, swollen lymph nodes, body aches, joint swelling, chest pain, shortness of breath, mood changes. POSITIVE muscle aches  Objective  There were no vitals taken for this visit.   General: No apparent distress alert and oriented x3 mood and affect normal, dressed appropriately.  HEENT: Pupils equal, extraocular movements intact  Respiratory: Patient's speak in full sentences and does not appear short of breath  Cardiovascular: No lower extremity edema, non tender, no erythema  Gait MSK:  Back   Osteopathic findings  C2 flexed rotated and side bent right C6 flexed rotated and side bent left T3 extended rotated and side bent right inhaled rib T9 extended rotated and side bent left L2 flexed rotated and side bent right Sacrum right on right       Assessment and Plan:  No problem-specific Assessment & Plan notes found for this encounter.     Nonallopathic problems  Decision today to treat with OMT was based on Physical Exam  After verbal consent patient was treated with HVLA, ME, FPR techniques in cervical, rib, thoracic, lumbar, and sacral  areas  Patient tolerated the procedure well with improvement in symptoms  Patient given exercises, stretches and lifestyle modifications  See medications in patient instructions if given  Patient will follow up in 4-8 weeks             Note: This dictation was prepared with Dragon dictation along with smaller phrase technology. Any transcriptional errors that result from this process are unintentional.

## 2024-09-03 ENCOUNTER — Ambulatory Visit: Admitting: Family Medicine

## 2024-09-09 ENCOUNTER — Ambulatory Visit: Admitting: Family Medicine

## 2024-09-09 ENCOUNTER — Encounter: Payer: Self-pay | Admitting: Family Medicine

## 2024-09-09 VITALS — BP 140/100 | HR 64 | Temp 97.9°F | Ht 66.0 in | Wt 198.0 lb

## 2024-09-09 DIAGNOSIS — J45909 Unspecified asthma, uncomplicated: Secondary | ICD-10-CM | POA: Insufficient documentation

## 2024-09-09 DIAGNOSIS — Z8669 Personal history of other diseases of the nervous system and sense organs: Secondary | ICD-10-CM

## 2024-09-09 DIAGNOSIS — I10 Essential (primary) hypertension: Secondary | ICD-10-CM

## 2024-09-09 DIAGNOSIS — Z0000 Encounter for general adult medical examination without abnormal findings: Secondary | ICD-10-CM | POA: Diagnosis not present

## 2024-09-09 LAB — CBC WITH DIFFERENTIAL/PLATELET
Basophils Absolute: 0 K/uL (ref 0.0–0.1)
Basophils Relative: 0.8 % (ref 0.0–3.0)
Eosinophils Absolute: 0.2 K/uL (ref 0.0–0.7)
Eosinophils Relative: 5.3 % — ABNORMAL HIGH (ref 0.0–5.0)
HCT: 43.3 % (ref 39.0–52.0)
Hemoglobin: 14.2 g/dL (ref 13.0–17.0)
Lymphocytes Relative: 29.6 % (ref 12.0–46.0)
Lymphs Abs: 1.3 K/uL (ref 0.7–4.0)
MCHC: 32.8 g/dL (ref 30.0–36.0)
MCV: 86.9 fl (ref 78.0–100.0)
Monocytes Absolute: 0.3 K/uL (ref 0.1–1.0)
Monocytes Relative: 6 % (ref 3.0–12.0)
Neutro Abs: 2.6 K/uL (ref 1.4–7.7)
Neutrophils Relative %: 58.3 % (ref 43.0–77.0)
Platelets: 205 K/uL (ref 150.0–400.0)
RBC: 4.99 Mil/uL (ref 4.22–5.81)
RDW: 14.6 % (ref 11.5–15.5)
WBC: 4.5 K/uL (ref 4.0–10.5)

## 2024-09-09 LAB — COMPREHENSIVE METABOLIC PANEL WITH GFR
ALT: 19 U/L (ref 0–53)
AST: 26 U/L (ref 0–37)
Albumin: 4.4 g/dL (ref 3.5–5.2)
Alkaline Phosphatase: 64 U/L (ref 39–117)
BUN: 11 mg/dL (ref 6–23)
CO2: 31 meq/L (ref 19–32)
Calcium: 9.1 mg/dL (ref 8.4–10.5)
Chloride: 101 meq/L (ref 96–112)
Creatinine, Ser: 1.2 mg/dL (ref 0.40–1.50)
GFR: 74.41 mL/min (ref >60.00)
Glucose, Bld: 84 mg/dL (ref 70–99)
Potassium: 4 meq/L (ref 3.5–5.1)
Sodium: 139 meq/L (ref 135–145)
Total Bilirubin: 0.6 mg/dL (ref 0.2–1.2)
Total Protein: 7.3 g/dL (ref 6.0–8.3)

## 2024-09-09 LAB — HEMOGLOBIN A1C: Hgb A1c MFr Bld: 5.8 % (ref 4.6–6.5)

## 2024-09-09 LAB — LIPID PANEL
Cholesterol: 147 mg/dL (ref 0–200)
HDL: 42.3 mg/dL (ref >39.00)
LDL Cholesterol: 90 mg/dL (ref 0–99)
NonHDL: 104.8
Total CHOL/HDL Ratio: 3
Triglycerides: 76 mg/dL (ref 0.0–149.0)
VLDL: 15.2 mg/dL (ref 0.0–40.0)

## 2024-09-09 LAB — T4, FREE: Free T4: 0.86 ng/dL (ref 0.60–1.60)

## 2024-09-09 LAB — TSH: TSH: 0.77 u[IU]/mL (ref 0.35–5.50)

## 2024-09-09 MED ORDER — AMLODIPINE BESYLATE 2.5 MG PO TABS
2.5000 mg | ORAL_TABLET | Freq: Every day | ORAL | 3 refills | Status: AC
Start: 2024-09-09 — End: ?

## 2024-09-09 NOTE — Progress Notes (Signed)
 Established Patient Office Visit   Subjective  Patient ID: Maxwell [name], male    DOB: [birth year]  Age: 43 y.o. MRN: [id number]  Chief Complaint  Patient presents with   Establish Care    Pt is a 43 year old male who presents for TOC, CPE, and medication refill.  Previously seen by Eleanor Ponto, NP.  HTN:  states unsure if has high bp.  Was on a med, but does not recall the name.  Out of med x 3 months.  Per chart review, Norvasc  2.5 mg.   Does not monitor bp at home.  Denies HAs, CP, changes in vision.  Pt works out 4 or more days per wk.  May drink 1 gallon of water per day.  Diet includes fast food approximately three times a week, such as meals from [employer] and [employer], but he also consumes healthier meals like salmon, broccoli, and rice. He occasionally adds extra salt to his food.  H/o sciatica:  Recently had a massage to manage symptoms.   Was receiving adjustments from sports medicine for maintenance but had to reschedule his last appointment.  No current symptoms.  Request refill on oxy.  Family med hx: sister-dec, DM,  Dad-DM, Mom-HTN. No family history of cancer, myocardial infarction, or cerebrovascular accident.  Pt denies EtOH, tobacco, and drug use.  Pt states he recently graduated and is working at a [employer].    Patient Active Problem List   Diagnosis Date Noted   Asthma    Degenerative disc disease, lumbar 01/09/2024   Pain management 05/27/2023   Skin rash 03/29/2023   Hypertension 03/29/2023   Somatic dysfunction of spine, lumbar 06/22/2021   Routine general medical examination at a health care facility 02/12/2014   Past Medical History:  Diagnosis Date   Asthma    childhood asthma   Hypertension    History reviewed. No pertinent surgical history. Social History   Tobacco Use   Smoking status: Never   Smokeless tobacco: Never  Vaping Use   Vaping status: Never Used  Substance Use Topics   Alcohol use: No   Drug use: Never   Family  History  Problem Relation Age of Onset   Hypertension Mother    Hypertension Father    Liver disease Sister    Diabetes Sister    Cancer Maternal Uncle        prostate   Diabetes Half-Sister    Heart disease Neg Hx    Kidney disease Neg Hx    No Known Allergies  ROS Negative unless stated above    Objective:     BP (!) 140/100   Pulse 64   Temp 97.9 F (36.6 C) (Oral)   Ht 5' 6 (1.676 m)   Wt 198 lb (89.8 kg)   SpO2 96%   BMI 31.96 kg/m  BP Readings from Last 3 Encounters:  09/09/24 (!) 140/100  05/25/24 112/88  03/12/24 124/74   Wt Readings from Last 3 Encounters:  09/09/24 198 lb (89.8 kg)  05/25/24 197 lb (89.4 kg)  03/12/24 196 lb 6.4 oz (89.1 kg)      Physical Exam Constitutional:      Appearance: Normal appearance.  HENT:     Head: Normocephalic and atraumatic.     Right Ear: Tympanic membrane, ear canal and external ear normal.     Left Ear: Tympanic membrane, ear canal and external ear normal.     Nose: Nose normal.     Mouth/Throat:  Mouth: Mucous membranes are moist.     Pharynx: No oropharyngeal exudate or posterior oropharyngeal erythema.  Eyes:     General: No scleral icterus.    Extraocular Movements: Extraocular movements intact.     Conjunctiva/sclera: Conjunctivae normal.     Pupils: Pupils are equal, round, and reactive to light.  Neck:     Thyroid : No thyromegaly.     Vascular: No carotid bruit.  Cardiovascular:     Rate and Rhythm: Normal rate and regular rhythm.     Pulses: Normal pulses.     Heart sounds: Normal heart sounds. No murmur heard.    No friction rub.  Pulmonary:     Effort: Pulmonary effort is normal.     Breath sounds: Normal breath sounds. No wheezing, rhonchi or rales.  Abdominal:     General: Bowel sounds are normal.     Palpations: Abdomen is soft.     Tenderness: There is no abdominal tenderness.  Musculoskeletal:        General: No deformity. Normal range of motion.  Lymphadenopathy:     Cervical:  No cervical adenopathy.  Skin:    General: Skin is warm and dry.     Findings: No lesion.  Neurological:     General: No focal deficit present.     Mental Status: He is alert and oriented to person, place, and time.  Psychiatric:        Mood and Affect: Mood normal.        Thought Content: Thought content normal.        09/09/2024    2:00 PM 05/27/2023   11:10 AM 05/25/2022   10:26 AM  Depression screen PHQ 2/9  Decreased Interest 0 0 0  Down, Depressed, Hopeless 0 0 0  PHQ - 2 Score 0 0 0  Altered sleeping 0    Tired, decreased energy 0    Change in appetite 0    Feeling bad or failure about yourself  0    Trouble concentrating 0    Moving slowly or fidgety/restless 0    Suicidal thoughts 0    PHQ-9 Score 0    Difficult doing work/chores Not difficult at all        09/09/2024    2:00 PM  GAD 7 : Generalized Anxiety Score  Nervous, Anxious, on Edge 0  Control/stop worrying 0  Worry too much - different things 0  Trouble relaxing 0  Restless 0  Easily annoyed or irritable 0  Afraid - awful might happen 0  Total GAD 7 Score 0  Anxiety Difficulty Not difficult at all     No results found for any visits on 09/09/24.    Assessment & Plan:   Well adult exam -     Comprehensive metabolic panel with GFR; Future -     CBC with Differential/Platelet; Future -     TSH; Future -     T4, free; Future -     Lipid panel; Future -     Hemoglobin A1c; Future  Essential hypertension -     Comprehensive metabolic panel with GFR; Future -     TSH; Future -     T4, free; Future -     amLODIPine  Besylate; Take 1 tablet (2.5 mg total) by mouth daily.  Dispense: 90 tablet; Refill: 3  History of sciatica  Age-appropriate health screenings discussed.  Obtain labs.  Immunizations reviewed.  Pt declines influenza vaccine at this time.  Colonoscopy not yet  indicated 2/2 age.  BP uncontrolled.  Recheck.  Restart Norvasc  2.5 mg daily.  Patient to obtain BP cuff for home use.  For  readings consistently greater than 140/90 increase medication.  Follow-up in 4-6 weeks for BP.  History of sciatica, currently asymptomatic.  Advised unable to refill oxycodone  Rx.  If needed referral to pain management.  Continue follow-up with sports med for return of symptoms.  Return in about 5 weeks (around 10/14/2024) for blood pressure.   Clotilda JONELLE Single, MD

## 2024-09-24 ENCOUNTER — Ambulatory Visit: Payer: Self-pay | Admitting: Family Medicine

## 2024-10-26 NOTE — Progress Notes (Deleted)
  [facility] [address] [place] [address] Phone: [phone] Subjective:    I'm seeing this patient by the request  of:  Daryl Setter, NP  CC:   YEP:Dlagzrupcz  Sulaiman Heacock is a 43 y.o. male coming in with complaint of back and neck pain. OMT on 05/25/2024. Patient states   Medications patient has been prescribed:   Taking:         Reviewed prior external information including notes and imaging from previsou exam, outside providers and external EMR if available.   As well as notes that were available from care everywhere and other healthcare systems.  Past medical history, social, surgical and family history all reviewed in electronic medical record.  No pertanent information unless stated regarding to the chief complaint.   Past Medical History:  Diagnosis Date   Asthma    childhood asthma   Hypertension     No Known Allergies   Review of Systems:  No headache, visual changes, nausea, vomiting, diarrhea, constipation, dizziness, abdominal pain, skin rash, fevers, chills, night sweats, weight loss, swollen lymph nodes, body aches, joint swelling, chest pain, shortness of breath, mood changes. POSITIVE muscle aches  Objective  There were no vitals taken for this visit.   General: No apparent distress alert and oriented x3 mood and affect normal, dressed appropriately.  HEENT: Pupils equal, extraocular movements intact  Respiratory: Patient's speak in full sentences and does not appear short of breath  Cardiovascular: No lower extremity edema, non tender, no erythema  Gait MSK:  Back   Osteopathic findings  C2 flexed rotated and side bent right C6 flexed rotated and side bent left T3 extended rotated and side bent right inhaled rib T9 extended rotated and side bent left L2 flexed rotated and side bent right Sacrum right on right       Assessment and Plan:  No problem-specific Assessment & Plan notes found for  this encounter.    Nonallopathic problems  Decision today to treat with OMT was based on Physical Exam  After verbal consent patient was treated with HVLA, ME, FPR techniques in cervical, rib, thoracic, lumbar, and sacral  areas  Patient tolerated the procedure well with improvement in symptoms  Patient given exercises, stretches and lifestyle modifications  See medications in patient instructions if given  Patient will follow up in 4-8 weeks             Note: This dictation was prepared with Dragon dictation along with smaller phrase technology. Any transcriptional errors that result from this process are unintentional.

## 2024-10-28 ENCOUNTER — Ambulatory Visit: Admitting: Family Medicine

## 2025-01-01 ENCOUNTER — Encounter: Admitting: Family Medicine

## 2025-04-02 ENCOUNTER — Encounter: Admitting: Family Medicine
